# Patient Record
Sex: Male | Born: 1940 | Race: White | Hispanic: No | Marital: Married | Smoking: Never smoker
Health system: Southern US, Community
[De-identification: ages and names within clinical notes are randomized; demographics above are authoritative.]

## PROBLEM LIST (undated history)

## (undated) DIAGNOSIS — E119 Type 2 diabetes mellitus without complications: Secondary | ICD-10-CM

## (undated) DIAGNOSIS — I509 Heart failure, unspecified: Secondary | ICD-10-CM

---

## 2015-02-11 DIAGNOSIS — N138 Other obstructive and reflux uropathy: Secondary | ICD-10-CM | POA: Diagnosis present

## 2017-03-07 DIAGNOSIS — E119 Type 2 diabetes mellitus without complications: Secondary | ICD-10-CM

## 2018-07-23 DEATH — deceased

## 2020-08-10 DIAGNOSIS — N182 Chronic kidney disease, stage 2 (mild): Secondary | ICD-10-CM | POA: Diagnosis present

## 2020-08-10 DIAGNOSIS — I10 Essential (primary) hypertension: Secondary | ICD-10-CM | POA: Diagnosis present

## 2020-08-10 DIAGNOSIS — I259 Chronic ischemic heart disease, unspecified: Secondary | ICD-10-CM | POA: Diagnosis present

## 2020-08-10 DIAGNOSIS — N183 Chronic kidney disease, stage 3 unspecified: Secondary | ICD-10-CM | POA: Diagnosis present

## 2020-08-10 DIAGNOSIS — G479 Sleep disorder, unspecified: Secondary | ICD-10-CM | POA: Diagnosis present

## 2020-08-10 DIAGNOSIS — J449 Chronic obstructive pulmonary disease, unspecified: Secondary | ICD-10-CM | POA: Diagnosis present

## 2020-08-10 DIAGNOSIS — N1831 Chronic kidney disease, stage 3a: Secondary | ICD-10-CM | POA: Diagnosis present

## 2020-08-10 DIAGNOSIS — Z7722 Contact with and (suspected) exposure to environmental tobacco smoke (acute) (chronic): Secondary | ICD-10-CM

## 2020-08-10 DIAGNOSIS — I5020 Unspecified systolic (congestive) heart failure: Secondary | ICD-10-CM | POA: Diagnosis present

## 2020-08-10 DIAGNOSIS — I480 Paroxysmal atrial fibrillation: Secondary | ICD-10-CM | POA: Diagnosis present

## 2020-08-10 DIAGNOSIS — D649 Anemia, unspecified: Secondary | ICD-10-CM | POA: Diagnosis present

## 2020-08-10 HISTORY — DX: Contact with and (suspected) exposure to environmental tobacco smoke (acute) (chronic): Z77.22

## 2021-10-03 ENCOUNTER — Observation Stay: Payer: Medicare PPO

## 2021-10-03 ENCOUNTER — Other Ambulatory Visit: Payer: Self-pay

## 2021-10-03 ENCOUNTER — Inpatient Hospital Stay
Admission: EM | Admit: 2021-10-03 | Discharge: 2021-10-09 | DRG: 871 | Disposition: A | Discharge status: Home Health | Payer: Medicare PPO | Attending: Internal Medicine | Admitting: Internal Medicine

## 2021-10-03 ENCOUNTER — Emergency Department: Payer: Medicare PPO

## 2021-10-03 DIAGNOSIS — D6959 Other secondary thrombocytopenia: Secondary | ICD-10-CM | POA: Diagnosis not present

## 2021-10-03 DIAGNOSIS — A4189 Other specified sepsis: Principal | ICD-10-CM | POA: Diagnosis present

## 2021-10-03 DIAGNOSIS — N138 Other obstructive and reflux uropathy: Secondary | ICD-10-CM | POA: Diagnosis present

## 2021-10-03 DIAGNOSIS — N401 Enlarged prostate with lower urinary tract symptoms: Secondary | ICD-10-CM | POA: Diagnosis present

## 2021-10-03 DIAGNOSIS — R531 Weakness: Secondary | ICD-10-CM

## 2021-10-03 DIAGNOSIS — R6520 Severe sepsis without septic shock: Secondary | ICD-10-CM | POA: Diagnosis present

## 2021-10-03 DIAGNOSIS — E119 Type 2 diabetes mellitus without complications: Secondary | ICD-10-CM

## 2021-10-03 DIAGNOSIS — G479 Sleep disorder, unspecified: Secondary | ICD-10-CM | POA: Diagnosis present

## 2021-10-03 DIAGNOSIS — I259 Chronic ischemic heart disease, unspecified: Secondary | ICD-10-CM | POA: Diagnosis present

## 2021-10-03 DIAGNOSIS — Z20822 Contact with and (suspected) exposure to covid-19: Secondary | ICD-10-CM | POA: Diagnosis present

## 2021-10-03 DIAGNOSIS — R6883 Chills (without fever): Secondary | ICD-10-CM | POA: Diagnosis present

## 2021-10-03 DIAGNOSIS — E785 Hyperlipidemia, unspecified: Secondary | ICD-10-CM | POA: Diagnosis present

## 2021-10-03 DIAGNOSIS — N183 Chronic kidney disease, stage 3 unspecified: Secondary | ICD-10-CM | POA: Diagnosis present

## 2021-10-03 DIAGNOSIS — N1831 Chronic kidney disease, stage 3a: Secondary | ICD-10-CM | POA: Diagnosis present

## 2021-10-03 DIAGNOSIS — J1008 Influenza due to other identified influenza virus with other specified pneumonia: Secondary | ICD-10-CM | POA: Diagnosis present

## 2021-10-03 DIAGNOSIS — A419 Sepsis, unspecified organism: Secondary | ICD-10-CM | POA: Diagnosis present

## 2021-10-03 DIAGNOSIS — Z7982 Long term (current) use of aspirin: Secondary | ICD-10-CM

## 2021-10-03 DIAGNOSIS — J440 Chronic obstructive pulmonary disease with acute lower respiratory infection: Secondary | ICD-10-CM | POA: Diagnosis present

## 2021-10-03 DIAGNOSIS — Z7901 Long term (current) use of anticoagulants: Secondary | ICD-10-CM

## 2021-10-03 DIAGNOSIS — E1122 Type 2 diabetes mellitus with diabetic chronic kidney disease: Secondary | ICD-10-CM | POA: Diagnosis present

## 2021-10-03 DIAGNOSIS — I5022 Chronic systolic (congestive) heart failure: Secondary | ICD-10-CM | POA: Diagnosis present

## 2021-10-03 DIAGNOSIS — R042 Hemoptysis: Secondary | ICD-10-CM | POA: Diagnosis not present

## 2021-10-03 DIAGNOSIS — Z7722 Contact with and (suspected) exposure to environmental tobacco smoke (acute) (chronic): Secondary | ICD-10-CM | POA: Diagnosis present

## 2021-10-03 DIAGNOSIS — J159 Unspecified bacterial pneumonia: Secondary | ICD-10-CM | POA: Diagnosis present

## 2021-10-03 DIAGNOSIS — I10 Essential (primary) hypertension: Secondary | ICD-10-CM | POA: Diagnosis present

## 2021-10-03 DIAGNOSIS — I5020 Unspecified systolic (congestive) heart failure: Secondary | ICD-10-CM | POA: Diagnosis present

## 2021-10-03 DIAGNOSIS — J449 Chronic obstructive pulmonary disease, unspecified: Secondary | ICD-10-CM | POA: Diagnosis present

## 2021-10-03 DIAGNOSIS — Z7984 Long term (current) use of oral hypoglycemic drugs: Secondary | ICD-10-CM

## 2021-10-03 DIAGNOSIS — J101 Influenza due to other identified influenza virus with other respiratory manifestations: Secondary | ICD-10-CM

## 2021-10-03 DIAGNOSIS — Z79899 Other long term (current) drug therapy: Secondary | ICD-10-CM

## 2021-10-03 DIAGNOSIS — J189 Pneumonia, unspecified organism: Secondary | ICD-10-CM | POA: Diagnosis not present

## 2021-10-03 DIAGNOSIS — N179 Acute kidney failure, unspecified: Principal | ICD-10-CM | POA: Diagnosis present

## 2021-10-03 DIAGNOSIS — I2510 Atherosclerotic heart disease of native coronary artery without angina pectoris: Secondary | ICD-10-CM | POA: Diagnosis present

## 2021-10-03 DIAGNOSIS — N182 Chronic kidney disease, stage 2 (mild): Secondary | ICD-10-CM | POA: Diagnosis present

## 2021-10-03 DIAGNOSIS — J9601 Acute respiratory failure with hypoxia: Secondary | ICD-10-CM | POA: Diagnosis present

## 2021-10-03 DIAGNOSIS — I130 Hypertensive heart and chronic kidney disease with heart failure and stage 1 through stage 4 chronic kidney disease, or unspecified chronic kidney disease: Secondary | ICD-10-CM | POA: Diagnosis present

## 2021-10-03 DIAGNOSIS — I480 Paroxysmal atrial fibrillation: Secondary | ICD-10-CM | POA: Diagnosis present

## 2021-10-03 DIAGNOSIS — D649 Anemia, unspecified: Secondary | ICD-10-CM | POA: Diagnosis present

## 2021-10-03 HISTORY — DX: Type 2 diabetes mellitus without complications: E11.9

## 2021-10-03 HISTORY — DX: Heart failure, unspecified: I50.9

## 2021-10-03 LAB — BASIC METABOLIC PANEL
Anion gap: 14 (ref 5–15)
BUN: 25 mg/dL — ABNORMAL HIGH (ref 8–23)
CO2: 21 mmol/L — ABNORMAL LOW (ref 22–32)
Calcium: 9 mg/dL (ref 8.9–10.3)
Chloride: 104 mmol/L (ref 98–111)
Creatinine, Ser: 1.7 mg/dL — ABNORMAL HIGH (ref 0.61–1.24)
GFR, Estimated: 40 mL/min — ABNORMAL LOW (ref >60)
Glucose, Bld: 234 mg/dL — ABNORMAL HIGH (ref 70–99)
Potassium: 4.2 mmol/L (ref 3.5–5.1)
Sodium: 139 mmol/L (ref 135–145)

## 2021-10-03 LAB — RESP PANEL BY RT-PCR (FLU A&B, COVID) ARPGX2
Influenza A by PCR: POSITIVE — AB
Influenza B by PCR: NEGATIVE
SARS Coronavirus 2 by RT PCR: NEGATIVE

## 2021-10-03 LAB — URINALYSIS, ROUTINE W REFLEX MICROSCOPIC
Bilirubin Urine: NEGATIVE
Glucose, UA: >=500 mg/dL — AB
Ketones, ur: NEGATIVE mg/dL
Leukocytes,Ua: NEGATIVE
Nitrite: NEGATIVE
Protein, ur: 30 mg/dL — AB
Specific Gravity, Urine: 1.03 (ref 1.005–1.030)
Squamous Epithelial / HPF: NONE SEEN (ref 0–5)
pH: 5 (ref 5.0–8.0)

## 2021-10-03 LAB — CBC
HCT: 42.4 % (ref 39.0–52.0)
Hemoglobin: 13.1 g/dL (ref 13.0–17.0)
MCH: 29.4 pg (ref 26.0–34.0)
MCHC: 30.9 g/dL (ref 30.0–36.0)
MCV: 95.1 fL (ref 80.0–100.0)
Platelets: 159 10*3/uL (ref 150–400)
RBC: 4.46 MIL/uL (ref 4.22–5.81)
RDW: 15.3 % (ref 11.5–15.5)
WBC: 7.8 10*3/uL (ref 4.0–10.5)
nRBC: 0 % (ref 0.0–0.2)

## 2021-10-03 LAB — LACTIC ACID, PLASMA
Lactic Acid, Venous: 3.5 mmol/L (ref 0.5–1.9)
Lactic Acid, Venous: 1.8 mmol/L (ref 0.5–1.9)

## 2021-10-03 LAB — HEPATIC FUNCTION PANEL
ALT: 31 U/L (ref 0–44)
AST: 36 U/L (ref 15–41)
Albumin: 4.2 g/dL (ref 3.5–5.0)
Alkaline Phosphatase: 64 U/L (ref 38–126)
Bilirubin, Direct: 0.2 mg/dL (ref 0.0–0.2)
Indirect Bilirubin: 0.4 mg/dL (ref 0.3–0.9)
Total Bilirubin: 0.6 mg/dL (ref 0.3–1.2)
Total Protein: 7.9 g/dL (ref 6.5–8.1)

## 2021-10-03 LAB — CBG MONITORING, ED: Glucose-Capillary: 255 mg/dL — ABNORMAL HIGH (ref 70–99)

## 2021-10-03 LAB — PROTIME-INR
INR: 1.2 (ref 0.8–1.2)
Prothrombin Time: 15.3 seconds — ABNORMAL HIGH (ref 11.4–15.2)

## 2021-10-03 LAB — LIPASE, BLOOD: Lipase: 42 U/L (ref 11–51)

## 2021-10-03 LAB — TROPONIN I (HIGH SENSITIVITY)
Troponin I (High Sensitivity): 5 ng/L (ref <18)
Troponin I (High Sensitivity): 5 ng/L (ref <18)

## 2021-10-03 MED ORDER — DOCUSATE SODIUM 100 MG PO CAPS
100.0000 mg | ORAL_CAPSULE | Freq: Two times a day (BID) | ORAL | Status: DC
  Administered 2021-10-05 – 2021-10-09 (×7): 100 mg via ORAL
  Filled 2021-10-03 (×10): qty 1

## 2021-10-03 MED ORDER — OSELTAMIVIR PHOSPHATE 75 MG PO CAPS
75.0000 mg | ORAL_CAPSULE | Freq: Once | ORAL | Status: AC
  Administered 2021-10-03: 75 mg via ORAL
  Filled 2021-10-03: qty 1

## 2021-10-03 MED ORDER — OSELTAMIVIR PHOSPHATE 30 MG PO CAPS
30.0000 mg | ORAL_CAPSULE | Freq: Two times a day (BID) | ORAL | Status: AC
  Administered 2021-10-04 – 2021-10-08 (×10): 30 mg via ORAL
  Filled 2021-10-03 (×11): qty 1

## 2021-10-03 MED ORDER — BISACODYL 5 MG PO TBEC: 5.0000 mg | DELAYED_RELEASE_TABLET | Freq: Every day | ORAL | Status: DC | PRN

## 2021-10-03 MED ORDER — POLYETHYLENE GLYCOL 3350 17 G PO PACK: 17.0000 g | PACK | Freq: Every day | ORAL | Status: DC | PRN

## 2021-10-03 MED ORDER — SODIUM CHLORIDE 0.9 % IV BOLUS
1000.0000 mL | Freq: Once | INTRAVENOUS | Status: AC
  Administered 2021-10-03: 1000 mL via INTRAVENOUS

## 2021-10-03 MED ORDER — ALBUTEROL SULFATE (2.5 MG/3ML) 0.083% IN NEBU
2.5000 mg | INHALATION_SOLUTION | Freq: Four times a day (QID) | RESPIRATORY_TRACT | Status: DC
  Administered 2021-10-04 (×4): 2.5 mg via RESPIRATORY_TRACT
  Filled 2021-10-03 (×4): qty 3

## 2021-10-03 MED ORDER — ACETAMINOPHEN 325 MG PO TABS
650.0000 mg | ORAL_TABLET | ORAL | Status: DC | PRN
  Administered 2021-10-03 – 2021-10-07 (×9): 650 mg via ORAL
  Filled 2021-10-03 (×11): qty 2

## 2021-10-03 MED ORDER — LACTATED RINGERS IV SOLN
INTRAVENOUS | Status: DC
  Administered 2021-10-04: 800 mL via INTRAVENOUS

## 2021-10-03 MED ORDER — ACETAMINOPHEN 500 MG PO TABS
1000.0000 mg | ORAL_TABLET | Freq: Once | ORAL | Status: AC
  Administered 2021-10-03: 1000 mg via ORAL
  Filled 2021-10-03: qty 2

## 2021-10-03 MED ORDER — ONDANSETRON HCL 4 MG/2ML IJ SOLN
4.0000 mg | Freq: Once | INTRAMUSCULAR | Status: AC
Start: 2021-10-03 — End: 2021-10-03
  Administered 2021-10-03: 4 mg via INTRAVENOUS
  Filled 2021-10-03: qty 2

## 2021-10-03 NOTE — ED Provider Notes (Signed)
? ?[facility] ?Provider Note ? ? ? Event Date/Time  ? First MD Initiated Contact with Patient 10/03/21 1718   ?  (approximate) ? ? ?History  ? ?Tremors ? ? ?HPI ? ?Marvin Carrillo is a 81 y.o. male with history of diabetes, cabg, A-fib on Eliquis who comes in with concerns for tremors.  Patient reports not feeling great this morning and then the car traveling and for around a bunch of other kids for graduation recently and then he stopped to get food at [employer] when he came out of the bathroom and was really shaky with a normal blood glucose.  He denies any falls or hitting his head states that he feels nauseous and not great overall.  He does report taking Tylenol this morning around 11 AM for his arthritis.  ? ?Physical Exam  ? ?Triage Vital Signs: ?ED Triage Vitals  ?Enc Vitals Group  ?   BP 10/03/21 1703 (!) 190/95  ?   Pulse Rate 10/03/21 1703 78  ?   Resp 10/03/21 1703 (!) 22  ?   Temp 10/03/21 1703 (!) 101.1 ?F (38.4 ?C)  ?   Temp Source 10/03/21 1703 Oral  ?   SpO2 10/03/21 1703 95 %  ?   Weight 10/03/21 1704 170 lb (77.1 kg)  ?   Height 10/03/21 1704 5\' 10"  (1.778 m)  ?   Head Circumference --   ?   Peak Flow --   ?   Pain Score 10/03/21 1704 0  ?   Pain Loc --   ?   Pain Edu? --   ?   Excl. in GC? --   ? ? ?Most recent vital signs: ?Vitals:  ? 10/03/21 1703  ?BP: (!) 190/95  ?Pulse: 78  ?Resp: (!) 22  ?Temp: (!) 101.1 ?F (38.4 ?C)  ?SpO2: 95%  ? ? ? ?General: Awake, no distress.  ?CV:  Good peripheral perfusion.  ?Resp:  Normal effort.  ?Abd:  No distention.  Soft and nontender. ?Other:  No swelling noted ? ? ?ED Results / Procedures / Treatments  ? ?Labs ?(all labs ordered are listed, but only abnormal results are displayed) ?Labs Reviewed  ?PROTIME-INR - Abnormal; Notable for the following components:  ?    Result Value  ? Prothrombin Time 15.3 (*)   ? All other components within normal limits  ?CBG MONITORING, ED - Abnormal; Notable for the following components:  ?  Glucose-Capillary 255 (*)   ? All other components within normal limits  ?CULTURE, BLOOD (ROUTINE X 2)  ?CULTURE, BLOOD (ROUTINE X 2)  ?CBC  ?BASIC METABOLIC PANEL  ?LACTIC ACID, PLASMA  ?LACTIC ACID, PLASMA  ?URINALYSIS, ROUTINE W REFLEX MICROSCOPIC  ?TROPONIN I (HIGH SENSITIVITY)  ? ? ? ?EKG ? ?My interpretation of EKG: ? ?Sinus rate of 74 without any ST elevation, T wave version lead III, QTc 496 ? ?RADIOLOGY ?I have reviewed the x-ray personally interpreted I do not see any evidence of pneumonia but does have prior evidence of CABG ? ? ? ?PROCEDURES: ? ?Critical Care performed: No ? ?.1-3 Lead EKG Interpretation ?Performed by: 10/05/21, MD ?Authorized by: Concha Se, MD  ? ?  Interpretation: normal   ?  ECG rate:  70 ?  ECG rate assessment: normal   ?  Rhythm: sinus rhythm   ?  Ectopy: none   ?  Conduction: normal   ? ? ?MEDICATIONS ORDERED IN ED: ?Medications  ?ondansetron (ZOFRAN) injection 4 mg (  has no administration in time range)  ?sodium chloride 0.9 % bolus 1,000 mL (has no administration in time range)  ? ? ? ?IMPRESSION / MDM / ASSESSMENT AND PLAN / ED COURSE  ?I reviewed the triage vital signs and the nursing notes. ? ?Patient with tremors noted to be febrile.  Suspect this is the cause of patient not feeling well.  We will get blood cultures, lactate.  Given unclear source will hold off on antibiotics.  Abdomen soft and nontender so suspicion for acute abdominal process. ? ?CBC reassuring with normal white count ?BMP shows creatinine of 1.7.  Prior creatinine in earlier May was 1.1 ?Trope negative ?Influenza positive ? ? ?Attempted to stand patient up to ambulate him and he desatted down to 88% with just sitting up in bed and seemed really short of breath and weak therefore we discussed admission.  He is left with this plan. ? ?The patient is on the cardiac monitor to evaluate for evidence of arrhythmia and/or significant heart rate changes. ? ?  ? ? ?FINAL CLINICAL IMPRESSION(S) / ED  DIAGNOSES  ? ?Final diagnoses:  ?AKI (acute kidney injury) (HCC)  ?Influenza A  ?Weakness  ?Acute respiratory failure with hypoxia (HCC)  ? ? ? ?Rx / DC Orders  ? ?ED Discharge Orders   ? ? None  ? ?  ? ? ? ?Note:  This document was prepared using Dragon voice recognition software and may include unintentional dictation errors. ?  ?Concha Se, MD ?10/03/21 1957 ? ?

## 2021-10-03 NOTE — Assessment & Plan Note (Signed)
We will start pt on renally dosed tamiflu as 70 mg x 1 now followed by 30 mg bid for next 5 days. ?Droplet isolation.  ?

## 2021-10-03 NOTE — Assessment & Plan Note (Signed)
Lab Results  ?Component Value Date  ? CREATININE 1.70 (H) 10/03/2021  ?previous one reported to be 1.1. ?We will hold metformin/ farxiga entresto. ? ?

## 2021-10-03 NOTE — Assessment & Plan Note (Addendum)
A1C % 7.1 (A) 4.2 - 5.6 % ? ? [facility] [place](CLIA#[id number]) ?  ? ?Metformin/ Marcelline Deist  held due to abnormal renal function. ?Nonrenal processed / cleared meds on d/c for Diabetes management.  ? ? ? ?

## 2021-10-03 NOTE — Assessment & Plan Note (Signed)
Stable. ?PRN ventolin.  ?

## 2021-10-03 NOTE — Assessment & Plan Note (Signed)
cpap per home settings.  

## 2021-10-03 NOTE — Assessment & Plan Note (Signed)
Compensated.  ?We will continue asa 81/ Imdur.  ?Entresto held due to AKI.  ?

## 2021-10-03 NOTE — ED Notes (Signed)
Attempted to assist patient up to standing position to see how pt can tolerate per provider request. Pt barely able to sit up in bed. Pt destats to 88% when sitting up. Pt assisted back in bed. Dr. Jari Pigg at bedside observing trial. ?

## 2021-10-03 NOTE — Assessment & Plan Note (Signed)
Cont asa and atorvastatin.  ?Cont Imdur and eliquis.   ?EKG PRN. ? ?

## 2021-10-03 NOTE — ED Triage Notes (Signed)
Pt started having tremors while eating about 30 minutes ago. Pt concerned that bg was low, bg checked, bg 255. Pt states he had a coke at the restaurant to treat a possible low bg but it did not help. Pt with generalized tremors, ?

## 2021-10-03 NOTE — Assessment & Plan Note (Signed)
Pt off Beta blocker. ?Cont amiodarone and eliquis.  ? ?

## 2021-10-03 NOTE — H&P (Signed)
?History and Physical  ? ? ?PatientMarland Kitchen [name] AJO:[id number] DOB: 10/21/1940 ?DOA: 10/03/2021 ?DOS: the patient was seen and examined on 10/03/2021 ?PCP: Adele Schilder, MD  ?Patient coming from: Home ? ?Chief Complaint:  ?Chief Complaint  ?Patient presents with  ? Tremors  ? ?HPI: Marvin Carrillo is a 81 y.o. male with medical history significant of HTN, DM II. ?Pt left home and travelled to [place] for graduation of grandson. It was outdoors  ?Catered and on farm. ?Wife at bedside gives history. Visiting family in [place] and family. On way back to [facility] and he went to uses restroom . Wife got table and when he came out of restroom he was shaking all over and was freezing.  ?He was having chills. And [employer] they came her. She initially thought he was hypoglycemic and ,  ?Wife then got help to get him to car. Pt was very weak and could not walk.  ?She then stopped at ed as he was not getting better.  ? ?Review of Systems  ?Constitutional:  Positive for chills, fever and malaise/fatigue.  ?All other systems reviewed and are negative. ? ?Past Medical History:  ?Diagnosis Date  ? CHF (congestive heart failure) (HCC)   ? Diabetes mellitus without complication (HCC)   ? Passive smoker 08/10/2020  ? ?History reviewed. No pertinent surgical history. ?Social History:  reports that he has never smoked. He has never used smokeless tobacco. No history on file for alcohol use and drug use. ? ?No Known Allergies ? ?History reviewed. No pertinent family history. ? ?PTA  Medications   ?Amiodarone (PACERONE) 200 mg, oral, Daily  ? ?Amlodipine (NORVASC) 2.5 mg, oral, Daily  ? ?aspirin 81 mg EC tablet TAKE 1 TABLET BY MOUTH EVERY NIGHT AT BEDTIME  ? ?atorvastatin (LIPITOR) 80 mg tablet TAKE 1 TABLET BY MOUTH EVERY DAY  ?blood-glucose meter (True Metrix Glucose Meter) misc True Metrix Glucometer. Dx: E11.65. Duration of need--99--Lifetime. Use to test blood sugars twice daily.  ? ?Eliquis 5 mg tab TAKE 1 TABLET BY MOUTH TWICE  DAILY , STOP WARFARIN  ? ?Entresto 24-26 mg per tablet TAKE 1 TABLET BY MOUTH TWICE DAILY  ? ?Farxiga 10 mg tab tablet 1 tablet, oral, Daily  ?glucose blood (True Metrix Glucose Test Strip) test strip True Metrix Test Strips. Dx: E11.9. Duration of need--99--Lifetime. Use to test blood sugars bid.  ? ?isosorbide mononitrate (IMDUR) 30 mg, oral, Daily  ?Lancets misc 1 each, miscellaneous, Daily, Dx: Dm E11.9 check sugars bid  ? ?metformin (GLUCOPHAGE) 1,000 mg, oral, 2 times daily with meals    ? ?Physical Exam: ?Vitals:  ? 10/03/21 1950 10/03/21 2000 10/03/21 2100 10/03/21 2111  ?BP:  137/62 (!) 130/57   ?Pulse:  71 72   ?Resp:  (!) 26 (!) 24   ?Temp:    (!) 102.7 ?F (39.3 ?C)  ?TempSrc:    Oral  ?SpO2: (!) 88% 93% 95%   ?Weight:      ?Height:      ?Physical Exam ?Vitals and nursing note reviewed.  ?Constitutional:   ?   General: He is sleeping. He is not in acute distress. ?   Appearance: He is well-groomed. He is ill-appearing. He is not toxic-appearing or diaphoretic.  ?HENT:  ?   Head: Normocephalic and atraumatic.  ?   Right Ear: Hearing and external ear normal.  ?   Left Ear: Hearing and external ear normal.  ?   Nose: Nose normal. No nasal deformity.  ?  Mouth/Throat:  ?   Lips: Pink.  ?   Mouth: Mucous membranes are moist.  ?   Tongue: No lesions. Tongue deviates from midline.  ?   Pharynx: Oropharynx is clear.  ?   Comments: Deviated to left.  ?Eyes:  ?   General: Lids are normal.  ?   Extraocular Movements: Extraocular movements intact.  ?   Right eye: Normal extraocular motion and no nystagmus.  ?   Left eye: Normal extraocular motion and no nystagmus.  ?   Pupils: Pupils are equal, round, and reactive to light.  ?Cardiovascular:  ?   Rate and Rhythm: Normal rate and regular rhythm.  ?   Pulses: Normal pulses.     ?     Dorsalis pedis pulses are 2+ on the right side and 2+ on the left side.  ?   Heart sounds: Normal heart sounds.  ?Pulmonary:  ?   Effort: Pulmonary effort is normal.  ?   Breath sounds:  Examination of the right-lower field reveals rales. Examination of the left-lower field reveals rales. Rhonchi and rales present.  ?Abdominal:  ?   General: Bowel sounds are normal. There is no distension.  ?   Palpations: Abdomen is soft. There is no mass.  ?   Tenderness: There is no abdominal tenderness. There is no guarding.  ?   Hernia: No hernia is present.  ?Musculoskeletal:  ?   Right lower leg: No edema.  ?   Left lower leg: No edema.  ?Skin: ?   General: Skin is warm.  ?Neurological:  ?   General: No focal deficit present.  ?   Mental Status: He is oriented to person, place, and time and easily aroused. He is lethargic.  ?   Cranial Nerves: Cranial nerves 2-12 are intact.  ?   Motor: Motor function is intact.  ?Psychiatric:     ?   Attention and Perception: Attention normal.     ?   Speech: Speech normal.     ?   Behavior: Behavior is cooperative.     ?   Cognition and Memory: Cognition normal.  ? ? ?Data Reviewed: ?Results for orders placed or performed during the hospital encounter of 10/03/21 (from the past 24 hour(s))  ?CBG monitoring, ED     Status: Abnormal  ? Collection Time: 10/03/21  4:59 PM  ?Result Value Ref Range  ? Glucose-Capillary 255 (H) 70 - 99 mg/dL  ?Basic metabolic panel     Status: Abnormal  ? Collection Time: 10/03/21  5:26 PM  ?Result Value Ref Range  ? Sodium 139 135 - 145 mmol/L  ? Potassium 4.2 3.5 - 5.1 mmol/L  ? Chloride 104 98 - 111 mmol/L  ? CO2 21 (L) 22 - 32 mmol/L  ? Glucose, Bld 234 (H) 70 - 99 mg/dL  ? BUN 25 (H) 8 - 23 mg/dL  ? Creatinine, Ser 1.70 (H) 0.61 - 1.24 mg/dL  ? Calcium 9.0 8.9 - 10.3 mg/dL  ? GFR, Estimated 40 (L) >60 mL/min  ? Anion gap 14 5 - 15  ?CBC     Status: None  ? Collection Time: 10/03/21  5:26 PM  ?Result Value Ref Range  ? WBC 7.8 4.0 - 10.5 K/uL  ? RBC 4.46 4.22 - 5.81 MIL/uL  ? Hemoglobin 13.1 13.0 - 17.0 g/dL  ? HCT 42.4 39.0 - 52.0 %  ? MCV 95.1 80.0 - 100.0 fL  ? MCH 29.4 26.0 - 34.0 pg  ?  MCHC 30.9 30.0 - 36.0 g/dL  ? RDW 15.3 11.5 - 15.5 %   ? Platelets 159 150 - 400 K/uL  ? nRBC 0.0 0.0 - 0.2 %  ?Troponin I (High Sensitivity)     Status: None  ? Collection Time: 10/03/21  5:26 PM  ?Result Value Ref Range  ? Troponin I (High Sensitivity) 5 <18 ng/L  ?Lactic acid, plasma     Status: Abnormal  ? Collection Time: 10/03/21  5:26 PM  ?Result Value Ref Range  ? Lactic Acid, Venous 3.5 (HH) 0.5 - 1.9 mmol/L  ?Protime-INR     Status: Abnormal  ? Collection Time: 10/03/21  5:26 PM  ?Result Value Ref Range  ? Prothrombin Time 15.3 (H) 11.4 - 15.2 seconds  ? INR 1.2 0.8 - 1.2  ?Urinalysis, Routine w reflex microscopic     Status: Abnormal  ? Collection Time: 10/03/21  5:26 PM  ?Result Value Ref Range  ? Color, Urine YELLOW (A) YELLOW  ? APPearance CLEAR (A) CLEAR  ? Specific Gravity, Urine 1.030 1.005 - 1.030  ? pH 5.0 5.0 - 8.0  ? Glucose, UA >=500 (A) NEGATIVE mg/dL  ? Hgb urine dipstick SMALL (A) NEGATIVE  ? Bilirubin Urine NEGATIVE NEGATIVE  ? Ketones, ur NEGATIVE NEGATIVE mg/dL  ? Protein, ur 30 (A) NEGATIVE mg/dL  ? Nitrite NEGATIVE NEGATIVE  ? Leukocytes,Ua NEGATIVE NEGATIVE  ? RBC / HPF 0-5 0 - 5 RBC/hpf  ? WBC, UA 0-5 0 - 5 WBC/hpf  ? Bacteria, UA RARE (A) NONE SEEN  ? Squamous Epithelial / LPF NONE SEEN 0 - 5  ?Hepatic function panel     Status: None  ? Collection Time: 10/03/21  5:26 PM  ?Result Value Ref Range  ? Total Protein 7.9 6.5 - 8.1 g/dL  ? Albumin 4.2 3.5 - 5.0 g/dL  ? AST 36 15 - 41 U/L  ? ALT 31 0 - 44 U/L  ? Alkaline Phosphatase 64 38 - 126 U/L  ? Total Bilirubin 0.6 0.3 - 1.2 mg/dL  ? Bilirubin, Direct 0.2 0.0 - 0.2 mg/dL  ? Indirect Bilirubin 0.4 0.3 - 0.9 mg/dL  ?Lipase, blood     Status: None  ? Collection Time: 10/03/21  5:26 PM  ?Result Value Ref Range  ? Lipase 42 11 - 51 U/L  ?Resp Panel by RT-PCR (Flu A&B, Covid) Nasopharyngeal Swab     Status: Abnormal  ? Collection Time: 10/03/21  6:28 PM  ? Specimen: Nasopharyngeal Swab; Nasopharyngeal(NP) swabs in vial transport medium  ?Result Value Ref Range  ? SARS Coronavirus 2 by RT PCR  NEGATIVE NEGATIVE  ? Influenza A by PCR POSITIVE (A) NEGATIVE  ? Influenza B by PCR NEGATIVE NEGATIVE  ? ? ?Assessment and Plan: ?Severe sepsis (HCC) ?Pt does meets severe sepsis criteria. ?We will provi

## 2021-10-03 NOTE — Assessment & Plan Note (Signed)
Pt has h/o anemia,we will follow cbc. ?Currently resolved.  ?CBC ?   ?Component Value Date/Time  ? WBC 7.8 10/03/2021 1726  ? RBC 4.46 10/03/2021 1726  ? HGB 13.1 10/03/2021 1726  ? HCT 42.4 10/03/2021 1726  ? PLT 159 10/03/2021 1726  ? MCV 95.1 10/03/2021 1726  ? MCH 29.4 10/03/2021 1726  ? MCHC 30.9 10/03/2021 1726  ? RDW 15.3 10/03/2021 1726  ? ?Type and screen/ IV ppi.  ?

## 2021-10-03 NOTE — Assessment & Plan Note (Signed)
Blood pressure 137/62, pulse 71, temperature (!) 101.1 ?F (38.4 ?C), temperature source Oral, resp. rate (!) 26, height 5\' 10"  (1.778 m), weight 77.1 kg, SpO2 93 %. ?Cont amlodipine/ imdur.  ?PRN hydralazine.  ? ? ?

## 2021-10-03 NOTE — ED Notes (Signed)
Patient transported to CT 

## 2021-10-03 NOTE — Assessment & Plan Note (Signed)
Pt does meets severe sepsis criteria. ?We will provide IVF support and monitor electrolytes and heart rate.  ?

## 2021-10-03 NOTE — ED Notes (Signed)
Dr. Patel at bedside 

## 2021-10-03 NOTE — Assessment & Plan Note (Signed)
We will get CT abd and pelvis due to AKI. ? ?

## 2021-10-04 DIAGNOSIS — R042 Hemoptysis: Secondary | ICD-10-CM | POA: Diagnosis not present

## 2021-10-04 DIAGNOSIS — Z79899 Other long term (current) drug therapy: Secondary | ICD-10-CM | POA: Diagnosis not present

## 2021-10-04 DIAGNOSIS — J101 Influenza due to other identified influenza virus with other respiratory manifestations: Secondary | ICD-10-CM | POA: Diagnosis not present

## 2021-10-04 DIAGNOSIS — I480 Paroxysmal atrial fibrillation: Secondary | ICD-10-CM

## 2021-10-04 DIAGNOSIS — J9601 Acute respiratory failure with hypoxia: Secondary | ICD-10-CM | POA: Diagnosis present

## 2021-10-04 DIAGNOSIS — A419 Sepsis, unspecified organism: Secondary | ICD-10-CM

## 2021-10-04 DIAGNOSIS — E1122 Type 2 diabetes mellitus with diabetic chronic kidney disease: Secondary | ICD-10-CM | POA: Diagnosis present

## 2021-10-04 DIAGNOSIS — N179 Acute kidney failure, unspecified: Secondary | ICD-10-CM | POA: Diagnosis present

## 2021-10-04 DIAGNOSIS — A4189 Other specified sepsis: Secondary | ICD-10-CM | POA: Diagnosis present

## 2021-10-04 DIAGNOSIS — N182 Chronic kidney disease, stage 2 (mild): Secondary | ICD-10-CM

## 2021-10-04 DIAGNOSIS — R6883 Chills (without fever): Secondary | ICD-10-CM | POA: Diagnosis present

## 2021-10-04 DIAGNOSIS — J440 Chronic obstructive pulmonary disease with acute lower respiratory infection: Secondary | ICD-10-CM | POA: Diagnosis present

## 2021-10-04 DIAGNOSIS — Z7722 Contact with and (suspected) exposure to environmental tobacco smoke (acute) (chronic): Secondary | ICD-10-CM | POA: Diagnosis present

## 2021-10-04 DIAGNOSIS — N401 Enlarged prostate with lower urinary tract symptoms: Secondary | ICD-10-CM | POA: Diagnosis present

## 2021-10-04 DIAGNOSIS — Z7982 Long term (current) use of aspirin: Secondary | ICD-10-CM | POA: Diagnosis not present

## 2021-10-04 DIAGNOSIS — J1008 Influenza due to other identified influenza virus with other specified pneumonia: Secondary | ICD-10-CM | POA: Diagnosis present

## 2021-10-04 DIAGNOSIS — N138 Other obstructive and reflux uropathy: Secondary | ICD-10-CM | POA: Diagnosis present

## 2021-10-04 DIAGNOSIS — I5022 Chronic systolic (congestive) heart failure: Secondary | ICD-10-CM | POA: Diagnosis present

## 2021-10-04 DIAGNOSIS — N1831 Chronic kidney disease, stage 3a: Secondary | ICD-10-CM | POA: Diagnosis present

## 2021-10-04 DIAGNOSIS — J159 Unspecified bacterial pneumonia: Secondary | ICD-10-CM | POA: Diagnosis present

## 2021-10-04 DIAGNOSIS — J189 Pneumonia, unspecified organism: Secondary | ICD-10-CM | POA: Diagnosis not present

## 2021-10-04 DIAGNOSIS — D649 Anemia, unspecified: Secondary | ICD-10-CM | POA: Diagnosis present

## 2021-10-04 DIAGNOSIS — Z7901 Long term (current) use of anticoagulants: Secondary | ICD-10-CM | POA: Diagnosis not present

## 2021-10-04 DIAGNOSIS — I130 Hypertensive heart and chronic kidney disease with heart failure and stage 1 through stage 4 chronic kidney disease, or unspecified chronic kidney disease: Secondary | ICD-10-CM | POA: Diagnosis present

## 2021-10-04 DIAGNOSIS — E785 Hyperlipidemia, unspecified: Secondary | ICD-10-CM | POA: Diagnosis present

## 2021-10-04 DIAGNOSIS — Z20822 Contact with and (suspected) exposure to covid-19: Secondary | ICD-10-CM | POA: Diagnosis present

## 2021-10-04 DIAGNOSIS — R6520 Severe sepsis without septic shock: Secondary | ICD-10-CM

## 2021-10-04 DIAGNOSIS — I2510 Atherosclerotic heart disease of native coronary artery without angina pectoris: Secondary | ICD-10-CM | POA: Diagnosis present

## 2021-10-04 DIAGNOSIS — D6959 Other secondary thrombocytopenia: Secondary | ICD-10-CM | POA: Diagnosis not present

## 2021-10-04 LAB — COMPREHENSIVE METABOLIC PANEL
ALT: 124 U/L — ABNORMAL HIGH (ref 0–44)
AST: 113 U/L — ABNORMAL HIGH (ref 15–41)
Albumin: 3.7 g/dL (ref 3.5–5.0)
Alkaline Phosphatase: 54 U/L (ref 38–126)
Anion gap: 8 (ref 5–15)
BUN: 24 mg/dL — ABNORMAL HIGH (ref 8–23)
CO2: 22 mmol/L (ref 22–32)
Calcium: 8.4 mg/dL — ABNORMAL LOW (ref 8.9–10.3)
Chloride: 111 mmol/L (ref 98–111)
Creatinine, Ser: 1.3 mg/dL — ABNORMAL HIGH (ref 0.61–1.24)
GFR, Estimated: 56 mL/min — ABNORMAL LOW (ref >60)
Glucose, Bld: 129 mg/dL — ABNORMAL HIGH (ref 70–99)
Potassium: 4.2 mmol/L (ref 3.5–5.1)
Sodium: 141 mmol/L (ref 135–145)
Total Bilirubin: 0.6 mg/dL (ref 0.3–1.2)
Total Protein: 7 g/dL (ref 6.5–8.1)

## 2021-10-04 LAB — CBC
HCT: 39.4 % (ref 39.0–52.0)
Hemoglobin: 12.6 g/dL — ABNORMAL LOW (ref 13.0–17.0)
MCH: 29.7 pg (ref 26.0–34.0)
MCHC: 32 g/dL (ref 30.0–36.0)
MCV: 92.9 fL (ref 80.0–100.0)
Platelets: 143 10*3/uL — ABNORMAL LOW (ref 150–400)
RBC: 4.24 MIL/uL (ref 4.22–5.81)
RDW: 15.3 % (ref 11.5–15.5)
WBC: 7.3 10*3/uL (ref 4.0–10.5)
nRBC: 0 % (ref 0.0–0.2)

## 2021-10-04 LAB — HEMOGLOBIN A1C
Hgb A1c MFr Bld: 7.4 % — ABNORMAL HIGH (ref 4.8–5.6)
Mean Plasma Glucose: 165.68 mg/dL

## 2021-10-04 LAB — PROCALCITONIN: Procalcitonin: 0.29 ng/mL

## 2021-10-04 LAB — GLUCOSE, CAPILLARY
Glucose-Capillary: 185 mg/dL — ABNORMAL HIGH (ref 70–99)
Glucose-Capillary: 214 mg/dL — ABNORMAL HIGH (ref 70–99)
Glucose-Capillary: 156 mg/dL — ABNORMAL HIGH (ref 70–99)

## 2021-10-04 MED ORDER — ALBUTEROL SULFATE (2.5 MG/3ML) 0.083% IN NEBU
2.5000 mg | INHALATION_SOLUTION | Freq: Three times a day (TID) | RESPIRATORY_TRACT | Status: DC
  Administered 2021-10-05 (×2): 2.5 mg via RESPIRATORY_TRACT
  Filled 2021-10-04 (×2): qty 3

## 2021-10-04 MED ORDER — AMIODARONE HCL 200 MG PO TABS
200.0000 mg | ORAL_TABLET | Freq: Every day | ORAL | Status: DC
  Administered 2021-10-04 – 2021-10-09 (×6): 200 mg via ORAL
  Filled 2021-10-04 (×6): qty 1

## 2021-10-04 MED ORDER — PHENOL 1.4 % MT LIQD
1.0000 | OROMUCOSAL | Status: DC | PRN
  Administered 2021-10-04: 1 via OROMUCOSAL
  Filled 2021-10-04: qty 177

## 2021-10-04 MED ORDER — ATORVASTATIN CALCIUM 20 MG PO TABS
80.0000 mg | ORAL_TABLET | Freq: Every day | ORAL | Status: DC
  Administered 2021-10-04 – 2021-10-09 (×6): 80 mg via ORAL
  Filled 2021-10-04 (×6): qty 4

## 2021-10-04 MED ORDER — APIXABAN 5 MG PO TABS
5.0000 mg | ORAL_TABLET | Freq: Two times a day (BID) | ORAL | Status: DC
  Administered 2021-10-04 – 2021-10-09 (×11): 5 mg via ORAL
  Filled 2021-10-04 (×11): qty 1

## 2021-10-04 MED ORDER — INSULIN ASPART 100 UNIT/ML IJ SOLN
0.0000 [IU] | Freq: Three times a day (TID) | INTRAMUSCULAR | Status: DC
  Administered 2021-10-04: 2 [IU] via SUBCUTANEOUS
  Administered 2021-10-04: 3 [IU] via SUBCUTANEOUS
  Administered 2021-10-05 – 2021-10-06 (×4): 2 [IU] via SUBCUTANEOUS
  Administered 2021-10-06: 1 [IU] via SUBCUTANEOUS
  Administered 2021-10-07 – 2021-10-08 (×4): 2 [IU] via SUBCUTANEOUS
  Administered 2021-10-08: 3 [IU] via SUBCUTANEOUS
  Administered 2021-10-08 – 2021-10-09 (×2): 2 [IU] via SUBCUTANEOUS
  Filled 2021-10-04 (×15): qty 1

## 2021-10-04 MED ORDER — SODIUM CHLORIDE 0.9 % IV SOLN
3.0000 g | Freq: Four times a day (QID) | INTRAVENOUS | Status: DC
  Administered 2021-10-04 – 2021-10-05 (×5): 3 g via INTRAVENOUS
  Filled 2021-10-04 (×2): qty 8
  Filled 2021-10-04 (×2): qty 3
  Filled 2021-10-04 (×2): qty 8
  Filled 2021-10-04: qty 3

## 2021-10-04 MED ORDER — GUAIFENESIN-DM 100-10 MG/5ML PO SYRP
5.0000 mL | ORAL_SOLUTION | ORAL | Status: DC | PRN
  Administered 2021-10-04 – 2021-10-09 (×9): 5 mL via ORAL
  Filled 2021-10-04 (×9): qty 10

## 2021-10-04 NOTE — Progress Notes (Addendum)
? ? ? ?Progress Note  ? ? ?Marvin Carrillo  [id number] DOB: 1940-09-22  DOA: 10/03/2021 ?PCP: Almyra Free, MD  ? ? ? ? ?Brief Narrative:  ? ? ?Medical records reviewed and are as summarized below: ? ?Marvin Carrillo is a 81 y.o. male with medical history significant for type II DM, hypertension, hyperlipidemia, paroxysmal atrial fibrillation on Eliquis, CKD stage II, chronic systolic CHF, CAD, who presented to the hospital with fever, chills and tremors. ? ?He was admitted to the hospital for severe sepsis secondary to influenza A infection complicated by acute hypoxic respiratory failure and AKI. ? ? ? ? ?Assessment/Plan:  ? ?Principal Problem: ?  Severe sepsis ([place]) ?Active Problems: ?  Influenza A ?  Acute respiratory failure with hypoxia ([place]) ?  AKI (acute kidney injury) ([place]) ?  Type 2 diabetes mellitus ([place]) ?  Anemia ?  Essential hypertension ?  BPH with obstruction/lower urinary tract symptoms ?  Chronic ischemic heart disease ?  CKD (chronic kidney disease) stage 2, GFR 60-89 ml/min ?  Heart failure with reduced ejection fraction ([place]) ?  Paroxysmal atrial fibrillation (HCC) ?  Sleep disorder ?  Chills ? ? ? ?Body mass index is 24.39 kg/m?. ? ? ?Severe sepsis secondary to influenza A infection: Continue Tamiflu.  Discontinue IV fluids to avoid fluid overload.  Discontinue IV Unasyn. ? ?Acute hypoxic respiratory failure: Continue 2 L/min oxygen via nasal cannula.  Taper off oxygen as able. ? ?AKI on CKD stage II: Creatinine is improving.  Monitor BMP off IV fluids ? ?Thrombocytopenia and elevated liver enzymes: This is likely from sepsis.  Monitor CBC and liver enzymes. ? ?Generalized weakness: Consult PT ? ?Paroxysmal atrial fibrillation: Continue Eliquis and amiodarone. ? ?Chronic systolic CHF: Compensated.  Hold Entresto because of AKI.  2D echo in August 2020 showed EF estimated at 35 to 40%. ? ?Type II DM: Hold [place].  NovoLog as needed for hyperglycemia. ? ?Diet Order   ? ?       ?  Diet heart healthy/carb  modified Room service appropriate? Yes; Fluid consistency: Thin  Diet effective now       ?  ? ?  ?  ? ?  ? ? ? ? ? ? ? ? ?Consultants: ?None ? ?Procedures: ?None ? ? ? ?Medications:  ? ? albuterol  2.5 mg Nebulization Q6H  ? amiodarone  200 mg Oral Daily  ? apixaban  5 mg Oral BID  ? atorvastatin  80 mg Oral Daily  ? docusate sodium  100 mg Oral BID  ? insulin aspart  0-9 Units Subcutaneous TID WC  ? oseltamivir  30 mg Oral BID  ? ?Continuous Infusions: ? ampicillin-sulbactam (UNASYN) IV Stopped (10/04/21 [id number])  ? lactated ringers 75 mL/hr at 10/04/21 1126  ? ? ? ?Anti-infectives (From admission, onward)  ? ? Start     Dose/Rate Route Frequency Ordered Stop  ? 10/04/21 1000  oseltamivir (TAMIFLU) capsule 30 mg       ? 30 mg Oral 2 times daily 10/03/21 1959 10/09/21 0959  ? 10/04/21 0200  Ampicillin-Sulbactam (UNASYN) 3 g in sodium chloride 0.9 % 100 mL IVPB       ? 3 g ?200 mL/hr over 30 Minutes Intravenous Every 6 hours 10/04/21 0110    ? 10/03/21 2015  oseltamivir (TAMIFLU) capsule 75 mg       ? 75 mg Oral  Once 10/03/21 1953 10/03/21 2029  ? ?  ? ? ? ? ? ? ? ? ? ?  Family Communication/Anticipated D/C date and plan/Code Status  ? ?DVT prophylaxis:  ?apixaban (ELIQUIS) tablet 5 mg  ? ?  Code Status: Full Code ? ?Family Communication: Plan discussed with his wife at the bedside ?Disposition Plan: Plan to discharge home in 1 to 2 days ? ? ?Status is: Observation ?The patient will require care spanning > 2 midnights and should be moved to inpatient because: Hypoxia ? ? ? ? ? ? ?Subjective:  ? ?Interval events noted.  He had fever this morning with Tmax of 102.9 ?F.  He complains of generalized weakness and cough. ? ?Objective:  ? ? ?Vitals:  ? 10/04/21 0205 10/04/21 0550 10/04/21 0717 10/04/21 0840  ?BP:  (!) 147/67  (!) 127/55  ?Pulse:  65  67  ?Resp:  16  17  ?Temp:  (!) 102.9 ?F (39.4 ?C)  (!) 100.7 ?F (38.2 ?C)  ?TempSrc:      ?SpO2: 91% (!) 88% (!) 84% 95%  ?Weight:      ?Height:      ? ?No data  found. ? ? ?Intake/Output Summary (Last 24 hours) at 10/04/2021 1210 ?Last data filed at 10/04/2021 1126 ?Gross per 24 hour  ?Intake 1471.48 ml  ?Output 200 ml  ?Net 1271.48 ml  ? ?Filed Weights  ? 10/03/21 1704  ?Weight: 77.1 kg  ? ? ?Exam: ? ?GEN: NAD ?SKIN: No rash ?EYES: EOMI ?ENT: MMM ?CV: RRR ?PULM: Occasional mild wheezing ?ABD: soft, ND, NT, +BS ?CNS: AAO x 3, non focal ?EXT: No edema or tenderness ? ? ? ?  ? ? ?Data Reviewed:  ? ?I have personally reviewed following labs and imaging studies: ? ?Labs: ?Labs show the following:  ? ?Basic Metabolic Panel: ?Recent Labs  ?Lab 10/03/21 ?1726 10/04/21 ?[id number]  ?NA 139 141  ?K 4.2 4.2  ?CL 104 111  ?CO2 21* 22  ?GLUCOSE 234* 129*  ?BUN 25* 24*  ?CREATININE 1.70* 1.30*  ?CALCIUM 9.0 8.4*  ? ?GFR ?Estimated Creatinine Clearance: 46.8 mL/min (A) (by C-G formula based on SCr of 1.3 mg/dL (H)). ?Liver Function Tests: ?Recent Labs  ?Lab 10/03/21 ?1726 10/04/21 ?[id number]  ?AST 36 113*  ?ALT 31 124*  ?ALKPHOS 64 54  ?BILITOT 0.6 0.6  ?PROT 7.9 7.0  ?ALBUMIN 4.2 3.7  ? ?Recent Labs  ?Lab 10/03/21 ?1726  ?LIPASE 42  ? ?No results for input(s): AMMONIA in the last 168 hours. ?Coagulation profile ?Recent Labs  ?Lab 10/03/21 ?1726  ?INR 1.2  ? ? ?CBC: ?Recent Labs  ?Lab 10/03/21 ?1726 10/04/21 ?[id number]  ?WBC 7.8 7.3  ?HGB 13.1 12.6*  ?HCT 42.4 39.4  ?MCV 95.1 92.9  ?PLT 159 143*  ? ?Cardiac Enzymes: ?No results for input(s): CKTOTAL, CKMB, CKMBINDEX, TROPONINI in the last 168 hours. ?BNP (last 3 results) ?No results for input(s): PROBNP in the last 8760 hours. ?CBG: ?Recent Labs  ?Lab 10/03/21 ?[id number]  ?GLUCAP 255*  ? ?D-Dimer: ?No results for input(s): DDIMER in the last 72 hours. ?Hgb A1c: ?Recent Labs  ?  10/03/21 ?1726  ?HGBA1C 7.4*  ? ?Lipid Profile: ?No results for input(s): CHOL, HDL, LDLCALC, TRIG, CHOLHDL, LDLDIRECT in the last 72 hours. ?Thyroid function studies: ?No results for input(s): TSH, T4TOTAL, T3FREE, THYROIDAB in the last 72 hours. ? ?Invalid input(s): FREET3 ?Anemia  work up: ?No results for input(s): VITAMINB12, FOLATE, FERRITIN, TIBC, IRON, RETICCTPCT in the last 72 hours. ?Sepsis Labs: ?Recent Labs  ?Lab 10/03/21 ?1726 10/03/21 ?2027 10/04/21 ?[id number]  ?PROCALCITON  --   --  0.29  ?WBC  7.8  --  7.3  ?LATICACIDVEN 3.5* 1.8  --   ? ? ?Microbiology ?Recent Results (from the past 240 hour(s))  ?Culture, blood (Routine x 2)     Status: None (Preliminary result)  ? Collection Time: 10/03/21  5:26 PM  ? Specimen: BLOOD  ?Result Value Ref Range Status  ? Specimen Description BLOOD BOTTLES DRAWN AEROBIC AND ANAEROBIC  Final  ? Special Requests   Final  ?  Blood Culture results may not be optimal due to an inadequate volume of blood received in culture bottles BLOOD RIGHT HAND  ? Culture   Final  ?  NO GROWTH < 24 HOURS ?Performed at [facility], [address] [place], [place] [address] ?  ? Report Status PENDING  Incomplete  ?Culture, blood (Routine x 2)     Status: None (Preliminary result)  ? Collection Time: 10/03/21  5:26 PM  ? Specimen: BLOOD  ?Result Value Ref Range Status  ? Specimen Description BLOOD BOTTLES DRAWN AEROBIC AND ANAEROBIC  Final  ? Special Requests Blood Culture adequate volume BLOOD LEFT HAND  Final  ? Culture   Final  ?  NO GROWTH < 24 HOURS ?Performed at [facility], [address], [place], [place] [address] ?  ? Report Status PENDING  Incomplete  ?Resp Panel by RT-PCR (Flu A&B, Covid) Nasopharyngeal Swab     Status: Abnormal  ? Collection Time: 10/03/21  6:28 PM  ? Specimen: Nasopharyngeal Swab; Nasopharyngeal(NP) swabs in vial transport medium  ?Result Value Ref Range Status  ? SARS Coronavirus 2 by RT PCR NEGATIVE NEGATIVE Final  ?  Comment: (NOTE) ?SARS-CoV-2 target nucleic acids are NOT DETECTED. ? ?The SARS-CoV-2 RNA is generally detectable in upper respiratory ?specimens during the acute phase of infection. The lowest ?concentration of SARS-CoV-2 viral copies this assay can detect is ?138 copies/mL. A negative result does not  preclude SARS-Cov-2 ?infection and should not be used as the sole basis for treatment or ?other patient management decisions. A negative result may occur with  ?improper specimen collection/handling, submission of specimen o

## 2021-10-04 NOTE — Evaluation (Signed)
Physical Therapy Evaluation ?Patient Details ?Name: Marvin Carrillo ?MRN: [id number] ?DOB: 06-Jan-1941 ?Today's Date: 10/04/2021 ? ?History of Present Illness ? presented to ER secondary to acute onset of weakness, chills, tremors; admitted for management of acute respiratory failure, AKI and sepsis related to influenza A  ?Clinical Impression ? Patient resting in bed upon arrival to room; easily awakens to voice, agreeable to participation with session.  Denies pain and reports improvement in symptoms since initial admission.  Generally weak and deconditioned throughout all extremities due to acute illness, but no focal weakness appreciated; strength and ROM largely functional for basic transfers and mobility needs.  Able to complete bed mobility with mod indep; sit/stand, basic transfers and gait (30') with RW, cga/min assist.  Demonstrates reciprocal stepping pattern, slightly choppy and unsteady stepping performance; limited balance reactions, fair dependance on RW.  Do recommend continued use of RW for all gait efforts at this time; patient voiced understanding and agreement (reports having access to RW at home). ?Does endorse mild dizziness with initial transition to upright; vitals stable and WFL. No orthostasis noted.  Weaned to RA during session, maintaining sats >93-95% throughout session (at rest and with gait); left on RA end of session.  RN informed/aware. ?Would benefit from skilled PT to address above deficits and promote optimal return to PLOF.; Recommend transition to HHPT upon discharge from acute hospitalization. ?   ?   ? ?Recommendations for follow up therapy are one component of a multi-disciplinary discharge planning process, led by the attending physician.  Recommendations may be updated based on patient status, additional functional criteria and insurance authorization. ? ?Follow Up Recommendations Home health PT ? ?  ?Assistance Recommended at Discharge PRN  ?Patient can return home with the  following ? A little help with walking and/or transfers;A little help with bathing/dressing/bathroom ? ?  ?Equipment Recommendations    ?Recommendations for Other Services ?    ?  ?Functional Status Assessment Patient has had a recent decline in their functional status and demonstrates the ability to make significant improvements in function in a reasonable and predictable amount of time.  ? ?  ?Precautions / Restrictions Precautions ?Precautions: Fall ?Restrictions ?Weight Bearing Restrictions: No  ? ?  ? ?Mobility ? Bed Mobility ?Overal bed mobility: Modified Independent ?  ?  ?  ?  ?  ?  ?  ?  ? ?Transfers ?Overall transfer level: Needs assistance ?Equipment used: Rolling walker (2 wheels) ?Transfers: Sit to/from Stand ?Sit to Stand: Min guard ?  ?  ?  ?  ?  ?General transfer comment: min cuing for safety with transfers to prevent pulling on RW ?  ? ?Ambulation/Gait ?Ambulation/Gait assistance: Min guard, Min assist ?Gait Distance (Feet): 30 Feet ?Assistive device: Rolling walker (2 wheels) ?  ?  ?  ?  ?General Gait Details: reciprocal stepping pattern, slightly choppy and unsteady stepping performance; limited balance reactions, fair dependance on RW.  Do recommend continued use of RW for all gait efforts at this time; patient voiced understanding and agreement (reports having access to RW at home) ? ?Stairs ?  ?  ?  ?  ?  ? ?Wheelchair Mobility ?  ? ?Modified Rankin (Stroke Patients Only) ?  ? ?  ? ?Balance Overall balance assessment: Needs assistance ?Sitting-balance support: No upper extremity supported, Feet supported ?Sitting balance-Leahy Scale: Good ?  ?  ?Standing balance support: Bilateral upper extremity supported ?Standing balance-Leahy Scale: Fair ?  ?  ?  ?  ?  ?  ?  ?  ?  ?  ?  ?  ?   ? ? ? ?  Pertinent Vitals/Pain Pain Assessment ?Pain Assessment: No/denies pain  ? ? ?Home Living Family/patient expects to be discharged to:: Private residence ?Living Arrangements: Spouse/significant other ?Available  Help at Discharge: Family;Available PRN/intermittently ?Type of Home: House ?Home Access: Stairs to enter ?  ?Entrance Stairs-Number of Steps: 2 garage, none in back (typically uses back entrance) ?  ?Home Layout: Laundry or work area in basement;Two level;Able to live on main level with bedroom/bathroom ?Home Equipment: Agricultural consultant (2 wheels) ?   ?  ?Prior Function Prior Level of Function : Independent/Modified Independent ?  ?  ?  ?  ?  ?  ?Mobility Comments: Indep with ADLs, household and community mobilization without assist device; denies fall history.  In town for [employer] graduation ceremony (from [place] area) ?  ?  ? ? ?Hand Dominance  ?   ? ?  ?Extremity/Trunk Assessment  ? Upper Extremity Assessment ?Upper Extremity Assessment: Overall WFL for tasks assessed ?  ? ?Lower Extremity Assessment ?Lower Extremity Assessment: Overall WFL for tasks assessed (grossly at least 4/5 throughout) ?  ? ?   ?Communication  ? Communication: No difficulties  ?Cognition Arousal/Alertness: Awake/alert ?Behavior During Therapy: [facility] for tasks assessed/performed ?Overall Cognitive Status: Within Functional Limits for tasks assessed ?  ?  ?  ?  ?  ?  ?  ?  ?  ?  ?  ?  ?  ?  ?  ?  ?  ?  ?  ? ?  ?General Comments   ? ?  ?Exercises Other Exercises ?Other Exercises: Reviewed role of PT and progressive mobility, safety with transfers/gait, use of RW; patient voiced understanding of all information. ?Other Exercises: Mild dizziness with initial transition to upright; resolves with accommodation to position.  Vitals stable and WFL; no orthostasis noted.  ? ?Assessment/Plan  ?  ?PT Assessment Patient needs continued PT services  ?PT Problem List Decreased strength;Decreased activity tolerance;Decreased balance;Decreased mobility;Decreased knowledge of use of DME;Decreased safety awareness;Decreased knowledge of precautions;Cardiopulmonary status limiting activity ? ?   ?  ?PT Treatment Interventions DME instruction;Gait  training;Stair training;Functional mobility training;Therapeutic activities;Therapeutic exercise;Balance training;Patient/family education   ? ?PT Goals (Current goals can be found in the Care Plan section)  ?Acute Rehab PT Goals ?Patient Stated Goal: to return home ?PT Goal Formulation: With patient ?Time For Goal Achievement: 10/18/21 ?Potential to Achieve Goals: Good ? ?  ?Frequency Min 2X/week ?  ? ? ?Co-evaluation   ?  ?  ?  ?  ? ? ?  ?AM-PAC PT "6 Clicks" Mobility  ?Outcome Measure Help needed turning from your back to your side while in a flat bed without using bedrails?: None ?Help needed moving from lying on your back to sitting on the side of a flat bed without using bedrails?: None ?Help needed moving to and from a bed to a chair (including a wheelchair)?: A Little ?Help needed standing up from a chair using your arms (e.g., wheelchair or bedside chair)?: A Little ?Help needed to walk in hospital room?: A Little ?Help needed climbing 3-5 steps with a railing? : A Little ?6 Click Score: 20 ? ?  ?End of Session Equipment Utilized During Treatment: Gait belt ?Activity Tolerance: Patient tolerated treatment well ?Patient left: in bed;with call bell/phone within reach;with bed alarm set ?Nurse Communication: Mobility status ?PT Visit Diagnosis: Muscle weakness (generalized) (M62.81);Difficulty in walking, not elsewhere classified (R26.2) ?  ? ?Time: 9798-9211 ?PT Time Calculation (min) (ACUTE ONLY): 20 min ? ? ?Charges:   PT Evaluation ?$PT Eval  Moderate Complexity: 1 Mod ?PT Treatments ?$Therapeutic Activity: 8-22 mins ?  ?   ? ? ?Jeovany Huitron H. Manson PasseyBrown, PT, DPT, NCS ?10/04/21, 2:38 PM ?[phone] ? ?

## 2021-10-04 NOTE — Progress Notes (Signed)
?   10/03/21 2245  ?Assess: MEWS Score  ?Level of Consciousness Alert  ?Assess: MEWS Score  ?MEWS Temp 2  ?MEWS Systolic 0  ?MEWS Pulse 0  ?MEWS RR 1  ?MEWS LOC 0  ?MEWS Score 3  ?MEWS Score Color Yellow  ?Assess: if the MEWS score is Yellow or Red  ?Were vital signs taken at a resting state? Yes  ?Focused Assessment No change from prior assessment  ?Does the patient meet 2 or more of the SIRS criteria? Yes  ?Does the patient have a confirmed or suspected source of infection? Yes  ?Provider and Rapid Response Notified? No  ?MEWS guidelines implemented *See Row Information* No, previously yellow, continue vital signs every 4 hours  ?Treat  ?Pain Scale 0-10  ?Pain Score 0  ?Escalate  ?MEWS: Escalate Yellow: discuss with charge nurse/RN and consider discussing with provider and RRT  ?Notify: Charge Nurse/RN  ?Name of Charge Nurse/RN Notified Floyd, RN  ?Date Charge Nurse/RN Notified 10/03/21  ?Time Charge Nurse/RN Notified 2300  ? ? ?

## 2021-10-05 DIAGNOSIS — A419 Sepsis, unspecified organism: Secondary | ICD-10-CM | POA: Diagnosis not present

## 2021-10-05 DIAGNOSIS — N179 Acute kidney failure, unspecified: Secondary | ICD-10-CM | POA: Diagnosis not present

## 2021-10-05 DIAGNOSIS — J9601 Acute respiratory failure with hypoxia: Secondary | ICD-10-CM | POA: Diagnosis not present

## 2021-10-05 DIAGNOSIS — R6520 Severe sepsis without septic shock: Secondary | ICD-10-CM | POA: Diagnosis not present

## 2021-10-05 LAB — CBC
HCT: 37.3 % — ABNORMAL LOW (ref 39.0–52.0)
Hemoglobin: 11.7 g/dL — ABNORMAL LOW (ref 13.0–17.0)
MCH: 29.5 pg (ref 26.0–34.0)
MCHC: 31.4 g/dL (ref 30.0–36.0)
MCV: 94 fL (ref 80.0–100.0)
Platelets: 142 10*3/uL — ABNORMAL LOW (ref 150–400)
RBC: 3.97 MIL/uL — ABNORMAL LOW (ref 4.22–5.81)
RDW: 15.2 % (ref 11.5–15.5)
WBC: 8.7 10*3/uL (ref 4.0–10.5)
nRBC: 0 % (ref 0.0–0.2)

## 2021-10-05 LAB — COMPREHENSIVE METABOLIC PANEL
ALT: 394 U/L — ABNORMAL HIGH (ref 0–44)
AST: 330 U/L — ABNORMAL HIGH (ref 15–41)
Albumin: 3.1 g/dL — ABNORMAL LOW (ref 3.5–5.0)
Alkaline Phosphatase: 45 U/L (ref 38–126)
Anion gap: 9 (ref 5–15)
BUN: 25 mg/dL — ABNORMAL HIGH (ref 8–23)
CO2: 25 mmol/L (ref 22–32)
Calcium: 8.1 mg/dL — ABNORMAL LOW (ref 8.9–10.3)
Chloride: 107 mmol/L (ref 98–111)
Creatinine, Ser: 1.34 mg/dL — ABNORMAL HIGH (ref 0.61–1.24)
GFR, Estimated: 54 mL/min — ABNORMAL LOW (ref >60)
Glucose, Bld: 121 mg/dL — ABNORMAL HIGH (ref 70–99)
Potassium: 3.9 mmol/L (ref 3.5–5.1)
Sodium: 141 mmol/L (ref 135–145)
Total Bilirubin: 0.6 mg/dL (ref 0.3–1.2)
Total Protein: 6.2 g/dL — ABNORMAL LOW (ref 6.5–8.1)

## 2021-10-05 LAB — GLUCOSE, CAPILLARY
Glucose-Capillary: 154 mg/dL — ABNORMAL HIGH (ref 70–99)
Glucose-Capillary: 181 mg/dL — ABNORMAL HIGH (ref 70–99)
Glucose-Capillary: 156 mg/dL — ABNORMAL HIGH (ref 70–99)
Glucose-Capillary: 154 mg/dL — ABNORMAL HIGH (ref 70–99)

## 2021-10-05 MED ORDER — ALBUTEROL SULFATE (2.5 MG/3ML) 0.083% IN NEBU: 2.5000 mg | INHALATION_SOLUTION | Freq: Four times a day (QID) | RESPIRATORY_TRACT | Status: DC | PRN

## 2021-10-05 NOTE — Progress Notes (Signed)
? ? ? ?Progress Note  ? ? ?Marvin Carrillo  NAT:[id number] DOB: 01-22-1941  DOA: 10/03/2021 ?PCP: Adele Schilder, MD  ? ? ? ? ?Brief Narrative:  ? ? ?Medical records reviewed and are as summarized below: ? ?Marvin Carrillo is a 81 y.o. male with medical history significant for type II DM, hypertension, hyperlipidemia, paroxysmal atrial fibrillation on Eliquis, CKD stage II, chronic systolic CHF, CAD, who presented to the hospital with fever, chills and tremors. ? ?He was admitted to the hospital for severe sepsis secondary to influenza A infection complicated by acute hypoxic respiratory failure and AKI. ? ? ? ? ?Assessment/Plan:  ? ?Principal Problem: ?  Severe sepsis (HCC) ?Active Problems: ?  Influenza A ?  Acute respiratory failure with hypoxia (HCC) ?  AKI (acute kidney injury) (HCC) ?  Type 2 diabetes mellitus (HCC) ?  Anemia ?  Essential hypertension ?  BPH with obstruction/lower urinary tract symptoms ?  Chronic ischemic heart disease ?  CKD (chronic kidney disease) stage 2, GFR 60-89 ml/min ?  Heart failure with reduced ejection fraction (HCC) ?  Paroxysmal atrial fibrillation (HCC) ?  Sleep disorder ?  Chills ? ? ? ?Body mass index is 24.39 kg/m?. ? ? ?Severe sepsis secondary to influenza A infection: Continue Tamiflu.  Discontinue IV Unasyn and monitor off antibacterial agents ? ?Acute hypoxic respiratory failure: Improved.  He is tolerating room air. ? ?AKI on CKD stage II: Creatinine is better. ? ?Thrombocytopenia and elevated liver enzymes: Platelet count is stable but liver enzymes are trending upward.  Repeat liver enzymes tomorrow. ? ?Generalized weakness: PT recommended home health therapy ? ?Paroxysmal atrial fibrillation: Continue Eliquis and amiodarone. ? ?Chronic systolic CHF: Compensated.  Hold Entresto because of AKI.  2D echo in August 2020 showed EF estimated at 35 to 40%. ? ?Type II DM: Hold [place].  NovoLog as needed for hyperglycemia. ? ?Diet Order   ? ?       ?  Diet heart healthy/carb modified Room  service appropriate? Yes; Fluid consistency: Thin  Diet effective now       ?  ? ?  ?  ? ?  ? ? ? ? ? ? ? ? ?Consultants: ?None ? ?Procedures: ?None ? ? ? ?Medications:  ? ? albuterol  2.5 mg Nebulization TID  ? amiodarone  200 mg Oral Daily  ? apixaban  5 mg Oral BID  ? atorvastatin  80 mg Oral Daily  ? docusate sodium  100 mg Oral BID  ? insulin aspart  0-9 Units Subcutaneous TID WC  ? oseltamivir  30 mg Oral BID  ? ?Continuous Infusions: ? ? ? ? ?Anti-infectives (From admission, onward)  ? ? Start     Dose/Rate Route Frequency Ordered Stop  ? 10/04/21 1000  oseltamivir (TAMIFLU) capsule 30 mg       ? 30 mg Oral 2 times daily 10/03/21 1959 10/09/21 0959  ? 10/04/21 0200  Ampicillin-Sulbactam (UNASYN) 3 g in sodium chloride 0.9 % 100 mL IVPB  Status:  Discontinued       ? 3 g ?200 mL/hr over 30 Minutes Intravenous Every 6 hours 10/04/21 0110 10/05/21 0737  ? 10/03/21 2015  oseltamivir (TAMIFLU) capsule 75 mg       ? 75 mg Oral  Once 10/03/21 1953 10/03/21 2029  ? ?  ? ? ? ? ? ? ? ? ? ?Family Communication/Anticipated D/C date and plan/Code Status  ? ?DVT prophylaxis:  ?apixaban (ELIQUIS) tablet 5 mg  ? ?  Code Status: Full Code ? ?Family Communication: None ?Disposition Plan: Plan to discharge home tomorrow ? ? ?Status is: Observation ?The patient will require care spanning > 2 midnights and should be moved to inpatient because: Hypoxia ? ? ? ? ? ? ?Subjective:  ? ?Interval events noted.  He complains of cough and congestion.  He still feels weak although he is feeling better today.  No fever or chills.  No shortness of breath or chest pain  ? ?Objective:  ? ? ?Vitals:  ? 10/05/21 0014 10/05/21 0522 10/05/21 0747 10/05/21 1406  ?BP: 137/64 (!) 168/74 (!) 142/66   ?Pulse: (!) 57 (!) 58    ?Resp: 18 18 18    ?Temp: 98.5 ?F (36.9 ?C) 99.6 ?F (37.6 ?C) 98.6 ?F (37 ?C)   ?TempSrc:  Oral Oral   ?SpO2: 95% 96% 95% 95%  ?Weight:      ?Height:      ? ?No data found. ? ? ?Intake/Output Summary (Last 24 hours) at 10/05/2021  1528 ?Last data filed at 10/05/2021 1400 ?Gross per 24 hour  ?Intake 880 ml  ?Output 650 ml  ?Net 230 ml  ? ?Filed Weights  ? 10/03/21 1704  ?Weight: 77.1 kg  ? ? ?Exam: ? ?GEN: NAD ?SKIN: No rash ?EYES: EOMI ?ENT: MMM ?CV: RRR ?PULM: CTA B ?ABD: soft, ND, NT, +BS ?CNS: AAO x 3, non focal ?EXT: No edema or tenderness ? ? ? ? ? ?  ? ? ?Data Reviewed:  ? ?I have personally reviewed following labs and imaging studies: ? ?Labs: ?Labs show the following:  ? ?Basic Metabolic Panel: ?Recent Labs  ?Lab 10/03/21 ?1726 10/04/21 ?[id number] 10/05/21 ?0330  ?NA 139 141 141  ?K 4.2 4.2 3.9  ?CL 104 111 107  ?CO2 21* 22 25  ?GLUCOSE 234* 129* 121*  ?BUN 25* 24* 25*  ?CREATININE 1.70* 1.30* 1.34*  ?CALCIUM 9.0 8.4* 8.1*  ? ?GFR ?Estimated Creatinine Clearance: 45.4 mL/min (A) (by C-G formula based on SCr of 1.34 mg/dL (H)). ?Liver Function Tests: ?Recent Labs  ?Lab 10/03/21 ?1726 10/04/21 ?[id number] 10/05/21 ?0330  ?AST 36 113* 330*  ?ALT 31 124* 394*  ?ALKPHOS 64 54 45  ?BILITOT 0.6 0.6 0.6  ?PROT 7.9 7.0 6.2*  ?ALBUMIN 4.2 3.7 3.1*  ? ?Recent Labs  ?Lab 10/03/21 ?1726  ?LIPASE 42  ? ?No results for input(s): AMMONIA in the last 168 hours. ?Coagulation profile ?Recent Labs  ?Lab 10/03/21 ?1726  ?INR 1.2  ? ? ?CBC: ?Recent Labs  ?Lab 10/03/21 ?1726 10/04/21 ?[id number] 10/05/21 ?0330  ?WBC 7.8 7.3 8.7  ?HGB 13.1 12.6* 11.7*  ?HCT 42.4 39.4 37.3*  ?MCV 95.1 92.9 94.0  ?PLT 159 143* 142*  ? ?Cardiac Enzymes: ?No results for input(s): CKTOTAL, CKMB, CKMBINDEX, TROPONINI in the last 168 hours. ?BNP (last 3 results) ?No results for input(s): PROBNP in the last 8760 hours. ?CBG: ?Recent Labs  ?Lab 10/04/21 ?1210 10/04/21 ?1626 10/04/21 ?2158 10/05/21 ?[id number] 10/05/21 ?1151  ?GLUCAP 185* 214* 156* 154* 181*  ? ?D-Dimer: ?No results for input(s): DDIMER in the last 72 hours. ?Hgb A1c: ?Recent Labs  ?  10/03/21 ?1726  ?HGBA1C 7.4*  ? ?Lipid Profile: ?No results for input(s): CHOL, HDL, LDLCALC, TRIG, CHOLHDL, LDLDIRECT in the last 72 hours. ?Thyroid function  studies: ?No results for input(s): TSH, T4TOTAL, T3FREE, THYROIDAB in the last 72 hours. ? ?Invalid input(s): FREET3 ?Anemia work up: ?No results for input(s): VITAMINB12, FOLATE, FERRITIN, TIBC, IRON, RETICCTPCT in the last 72 hours. ?Sepsis Labs: ?Recent  Labs  ?Lab 10/03/21 ?1726 10/03/21 ?2027 10/04/21 ?[id number] 10/05/21 ?0330  ?PROCALCITON  --   --  0.29  --   ?WBC 7.8  --  7.3 8.7  ?LATICACIDVEN 3.5* 1.8  --   --   ? ? ?Microbiology ?Recent Results (from the past 240 hour(s))  ?Culture, blood (Routine x 2)     Status: None (Preliminary result)  ? Collection Time: 10/03/21  5:26 PM  ? Specimen: BLOOD  ?Result Value Ref Range Status  ? Specimen Description BLOOD BOTTLES DRAWN AEROBIC AND ANAEROBIC  Final  ? Special Requests   Final  ?  Blood Culture results may not be optimal due to an inadequate volume of blood received in culture bottles BLOOD RIGHT HAND  ? Culture   Final  ?  NO GROWTH 2 DAYS ?Performed at [facility], [address] [place], [place] [address] ?  ? Report Status PENDING  Incomplete  ?Culture, blood (Routine x 2)     Status: None (Preliminary result)  ? Collection Time: 10/03/21  5:26 PM  ? Specimen: BLOOD  ?Result Value Ref Range Status  ? Specimen Description BLOOD BOTTLES DRAWN AEROBIC AND ANAEROBIC  Final  ? Special Requests Blood Culture adequate volume BLOOD LEFT HAND  Final  ? Culture   Final  ?  NO GROWTH 2 DAYS ?Performed at [facility], [address], [place], [place] [address] ?  ? Report Status PENDING  Incomplete  ?Resp Panel by RT-PCR (Flu A&B, Covid) Nasopharyngeal Swab     Status: Abnormal  ? Collection Time: 10/03/21  6:28 PM  ? Specimen: Nasopharyngeal Swab; Nasopharyngeal(NP) swabs in vial transport medium  ?Result Value Ref Range Status  ? SARS Coronavirus 2 by RT PCR NEGATIVE NEGATIVE Final  ?  Comment: (NOTE) ?SARS-CoV-2 target nucleic acids are NOT DETECTED. ? ?The SARS-CoV-2 RNA is generally detectable in upper respiratory ?specimens during the acute  phase of infection. The lowest ?concentration of SARS-CoV-2 viral copies this assay can detect is ?138 copies/mL. A negative result does not preclude SARS-Cov-2 ?infection and should not be used as the

## 2021-10-05 NOTE — TOC Progression Note (Signed)
Transition of Care (TOC) - Progression Note  ? ? ?Patient Details  ?Name: Marvin Carrillo ?MRN: [id number] ?Date of Birth: [birth year] ? ?Transition of Care (TOC) CM/SW Contact  ?Pete Pelt, RN ?Phone Number: ?10/05/2021, 11:15 AM ? ?Clinical Narrative:   Notified [facility] of needs.  No equipment recommendations from PT ? ? ? ?  ?  ? ?Expected Discharge Plan and Services ?  ?  ?  ?  ?  ?                ?  ?  ?  ?  ?  ?  ?  ?  ?  ?  ? ? ?Social Determinants of Health (SDOH) Interventions ?  ? ?Readmission Risk Interventions ?   ? View : No data to display.  ?  ?  ?  ? ? ?

## 2021-10-05 NOTE — Progress Notes (Signed)
Physical Therapy Treatment ?Patient Details ?Name: Marvin Carrillo ?MRN: [id number] ?DOB: 03/25/1941 ?Today's Date: 10/05/2021 ? ? ?History of Present Illness presented to ER secondary to acute onset of weakness, chills, tremors; admitted for management of acute respiratory failure, AKI and sepsis related to influenza A ? ?  ?PT Comments  ? ? OOB with ease.  Stands with RW and is able to complete x 3 laps on small pod unit with overall improved gait.  He does have a RW available at home and is encouraged to use it initially with +1 from wife until he feels confident.  Pt is making good strides in mobility. ?  ?Recommendations for follow up therapy are one component of a multi-disciplinary discharge planning process, led by the attending physician.  Recommendations may be updated based on patient status, additional functional criteria and insurance authorization. ? ?Follow Up Recommendations ? Home health PT ?  ?  ?Assistance Recommended at Discharge PRN  ?Patient can return home with the following A little help with walking and/or transfers;A little help with bathing/dressing/bathroom;Help with stairs or ramp for entrance ?  ?Equipment Recommendations ?    ?  ?Recommendations for Other Services   ? ? ?  ?Precautions / Restrictions Precautions ?Precautions: Fall ?Restrictions ?Weight Bearing Restrictions: No  ?  ? ?Mobility ? Bed Mobility ?Overal bed mobility: Modified Independent ?  ?  ?  ?  ?  ?  ?  ?  ? ?Transfers ?Overall transfer level: Needs assistance ?Equipment used: Rolling walker (2 wheels) ?Transfers: Sit to/from Stand ?Sit to Stand: Min guard ?  ?  ?  ?  ?  ?  ?  ? ?Ambulation/Gait ?Ambulation/Gait assistance: Min guard ?Gait Distance (Feet): 180 Feet ?Assistive device: Rolling walker (2 wheels) ?Gait Pattern/deviations: Step-through pattern, Decreased step length - right, Decreased step length - left ?  ?  ?  ?General Gait Details: Significanlty improved gait today.  still recommend RW at home which he has  available and +1 from wife intially. ? ? ?Stairs ?  ?  ?  ?  ?  ? ? ?Wheelchair Mobility ?  ? ?Modified Rankin (Stroke Patients Only) ?  ? ? ?  ?Balance Overall balance assessment: Needs assistance ?Sitting-balance support: No upper extremity supported, Feet supported ?Sitting balance-Leahy Scale: Good ?  ?  ?Standing balance support: Bilateral upper extremity supported ?Standing balance-Leahy Scale: Fair ?  ?  ?  ?  ?  ?  ?  ?  ?  ?  ?  ?  ?  ? ?  ?Cognition Arousal/Alertness: Awake/alert ?Behavior During Therapy: [facility] for tasks assessed/performed ?Overall Cognitive Status: Within Functional Limits for tasks assessed ?  ?  ?  ?  ?  ?  ?  ?  ?  ?  ?  ?  ?  ?  ?  ?  ?  ?  ?  ? ?  ?Exercises   ? ?  ?General Comments   ?  ?  ? ?Pertinent Vitals/Pain Pain Assessment ?Pain Assessment: No/denies pain  ? ? ?Home Living   ?  ?  ?  ?  ?  ?  ?  ?  ?  ?   ?  ?Prior Function    ?  ?  ?   ? ?PT Goals (current goals can now be found in the care plan section) Progress towards PT goals: Progressing toward goals ? ?  ?Frequency ? ? ? Min 2X/week ? ? ? ?  ?PT Plan    ? ? ?  Co-evaluation   ?  ?  ?  ?  ? ?  ?AM-PAC PT "6 Clicks" Mobility   ?Outcome Measure ? Help needed turning from your back to your side while in a flat bed without using bedrails?: None ?Help needed moving from lying on your back to sitting on the side of a flat bed without using bedrails?: None ?Help needed moving to and from a bed to a chair (including a wheelchair)?: A Little ?Help needed standing up from a chair using your arms (e.g., wheelchair or bedside chair)?: A Little ?Help needed to walk in hospital room?: A Little ?Help needed climbing 3-5 steps with a railing? : A Little ?6 Click Score: 20 ? ?  ?End of Session Equipment Utilized During Treatment: Gait belt ?Activity Tolerance: Patient tolerated treatment well ?Patient left: in bed;with call bell/phone within reach;with bed alarm set ?Nurse Communication: Mobility status ?PT Visit Diagnosis: Muscle weakness  (generalized) (M62.81);Difficulty in walking, not elsewhere classified (R26.2) ?  ? ? ?Time: 0920-0929 ?PT Time Calculation (min) (ACUTE ONLY): 9 min ? ?Charges:  $Gait Training: 8-22 mins          ?         Chesley Noon, PTA ?10/05/21, 11:19 AM ? ?

## 2021-10-06 DIAGNOSIS — J101 Influenza due to other identified influenza virus with other respiratory manifestations: Secondary | ICD-10-CM | POA: Diagnosis not present

## 2021-10-06 DIAGNOSIS — A419 Sepsis, unspecified organism: Secondary | ICD-10-CM | POA: Diagnosis not present

## 2021-10-06 DIAGNOSIS — R6520 Severe sepsis without septic shock: Secondary | ICD-10-CM | POA: Diagnosis not present

## 2021-10-06 DIAGNOSIS — J9601 Acute respiratory failure with hypoxia: Secondary | ICD-10-CM | POA: Diagnosis not present

## 2021-10-06 LAB — COMPREHENSIVE METABOLIC PANEL
ALT: 341 U/L — ABNORMAL HIGH (ref 0–44)
AST: 193 U/L — ABNORMAL HIGH (ref 15–41)
Albumin: 3.3 g/dL — ABNORMAL LOW (ref 3.5–5.0)
Alkaline Phosphatase: 47 U/L (ref 38–126)
Anion gap: 10 (ref 5–15)
BUN: 28 mg/dL — ABNORMAL HIGH (ref 8–23)
CO2: 23 mmol/L (ref 22–32)
Calcium: 8.4 mg/dL — ABNORMAL LOW (ref 8.9–10.3)
Chloride: 104 mmol/L (ref 98–111)
Creatinine, Ser: 1.26 mg/dL — ABNORMAL HIGH (ref 0.61–1.24)
GFR, Estimated: 58 mL/min — ABNORMAL LOW (ref >60)
Glucose, Bld: 164 mg/dL — ABNORMAL HIGH (ref 70–99)
Potassium: 4.4 mmol/L (ref 3.5–5.1)
Sodium: 137 mmol/L (ref 135–145)
Total Bilirubin: 1.1 mg/dL (ref 0.3–1.2)
Total Protein: 6.8 g/dL (ref 6.5–8.1)

## 2021-10-06 LAB — CBC
HCT: 36.7 % — ABNORMAL LOW (ref 39.0–52.0)
Hemoglobin: 11.6 g/dL — ABNORMAL LOW (ref 13.0–17.0)
MCH: 29 pg (ref 26.0–34.0)
MCHC: 31.6 g/dL (ref 30.0–36.0)
MCV: 91.8 fL (ref 80.0–100.0)
Platelets: 150 10*3/uL (ref 150–400)
RBC: 4 MIL/uL — ABNORMAL LOW (ref 4.22–5.81)
RDW: 15 % (ref 11.5–15.5)
WBC: 8.2 10*3/uL (ref 4.0–10.5)
nRBC: 0 % (ref 0.0–0.2)

## 2021-10-06 LAB — GLUCOSE, CAPILLARY
Glucose-Capillary: 136 mg/dL — ABNORMAL HIGH (ref 70–99)
Glucose-Capillary: 176 mg/dL — ABNORMAL HIGH (ref 70–99)
Glucose-Capillary: 143 mg/dL — ABNORMAL HIGH (ref 70–99)
Glucose-Capillary: 228 mg/dL — ABNORMAL HIGH (ref 70–99)

## 2021-10-06 MED ORDER — KETOROLAC TROMETHAMINE 0.5 % OP SOLN
1.0000 [drp] | Freq: Four times a day (QID) | OPHTHALMIC | Status: DC
  Administered 2021-10-06 – 2021-10-09 (×10): 1 [drp] via OPHTHALMIC
  Filled 2021-10-06: qty 3

## 2021-10-06 MED ORDER — PREDNISOLONE ACETATE 1 % OP SUSP
1.0000 [drp] | Freq: Two times a day (BID) | OPHTHALMIC | Status: DC
  Administered 2021-10-06 – 2021-10-09 (×6): 1 [drp] via OPHTHALMIC
  Filled 2021-10-06: qty 1

## 2021-10-06 MED ORDER — ALBUTEROL SULFATE (2.5 MG/3ML) 0.083% IN NEBU
2.5000 mg | INHALATION_SOLUTION | Freq: Three times a day (TID) | RESPIRATORY_TRACT | Status: DC
Start: 2021-10-06 — End: 2021-10-09
  Administered 2021-10-06 – 2021-10-09 (×10): 2.5 mg via RESPIRATORY_TRACT
  Filled 2021-10-06 (×10): qty 3

## 2021-10-06 NOTE — Progress Notes (Signed)
Pls send Diet [employer] per pt's request. Thank You. ?

## 2021-10-06 NOTE — Progress Notes (Signed)
PT Cancellation Note ? ?Patient Details ?Name: Marvin Carrillo ?MRN: [id number] ?DOB: [birth year] ? ? ?Cancelled Treatment:    Reason Eval/Treat Not Completed: Patient declined, no reason specified ?Entered pt room with offer to work with PT.  He was laying back in bed, reported feeling miserable and stated that he couldn't do more activity today (apparently did walk some with nursing).  He said sorry and then asked PT to see if his nurse can get him some meds.  Deferred PT this date per pt request, will maintain on caseload and continue to follow as appropriate.  ? ?Malachi Pro, DPT ?10/06/2021, 6:33 PM ?

## 2021-10-06 NOTE — Progress Notes (Addendum)
? ? ? ?Progress Note  ? ? ?Marvin Carrillo  [id number] DOB: 1940/06/11  DOA: 10/03/2021 ?PCP: Almyra Free, MD  ? ? ? ? ?Brief Narrative:  ? ? ?Medical records reviewed and are as summarized below: ? ?Marvin Carrillo is a 81 y.o. male with medical history significant for type II DM, hypertension, hyperlipidemia, paroxysmal atrial fibrillation on Eliquis, CKD stage II, chronic systolic CHF, CAD, who presented to the hospital with fever, chills and tremors. ? ?He was admitted to the hospital for severe sepsis secondary to influenza A infection complicated by acute hypoxic respiratory failure and AKI. ? ? ? ? ?Assessment/Plan:  ? ?Principal Problem: ?  Severe sepsis ([place]) ?Active Problems: ?  Influenza A ?  Acute respiratory failure with hypoxia ([place]) ?  AKI (acute kidney injury) ([place]) ?  Type 2 diabetes mellitus ([place]) ?  Anemia ?  Essential hypertension ?  BPH with obstruction/lower urinary tract symptoms ?  Chronic ischemic heart disease ?  CKD (chronic kidney disease) stage 2, GFR 60-89 ml/min ?  Heart failure with reduced ejection fraction ([place]) ?  Paroxysmal atrial fibrillation (H[facility]) ?  Sleep disorder ?  Chills ? ? ? ?Body mass index is 24.39 kg/m?. ? ? ?Severe sepsis secondary to influenza A infection: Continue Tamiflu and antitussives ? ?Acute hypoxic respiratory failure: Improved ? ?AKI on CKD stage II: Creatinine is around baseline. ? ?Thrombocytopenia and elevated liver enzymes: Platelet count has normalized.  Liver enzymes are trending down.  Continue to monitor. ? ?Generalized weakness: PT recommended home health therapy.  Encourage ambulation with assistance today to avoid deconditioning. ? ?Paroxysmal atrial fibrillation: Continue Eliquis and amiodarone. ? ?Chronic systolic CHF: Compensated.  Hold Entresto because of AKI.  2D echo in August 2020 showed EF estimated at 35 to 40%. ? ?Type II DM: Hold [place].  NovoLog as needed for hyperglycemia. ? ?Diet Order   ? ?       ?  Diet heart healthy/carb modified Room  service appropriate? Yes; Fluid consistency: Thin  Diet effective now       ?  ? ?  ?  ? ?  ? ? ? ? ? ? ? ? ?Consultants: ?None ? ?Procedures: ?None ? ? ? ?Medications:  ? ? albuterol  2.5 mg Nebulization TID  ? amiodarone  200 mg Oral Daily  ? apixaban  5 mg Oral BID  ? atorvastatin  80 mg Oral Daily  ? docusate sodium  100 mg Oral BID  ? insulin aspart  0-9 Units Subcutaneous TID WC  ? oseltamivir  30 mg Oral BID  ? ?Continuous Infusions: ? ? ? ? ?Anti-infectives (From admission, onward)  ? ? Start     Dose/Rate Route Frequency Ordered Stop  ? 10/04/21 1000  oseltamivir (TAMIFLU) capsule 30 mg       ? 30 mg Oral 2 times daily 10/03/21 1959 10/09/21 0959  ? 10/04/21 0200  Ampicillin-Sulbactam (UNASYN) 3 g in sodium chloride 0.9 % 100 mL IVPB  Status:  Discontinued       ? 3 g ?200 mL/hr over 30 Minutes Intravenous Every 6 hours 10/04/21 0110 10/05/21 0737  ? 10/03/21 2015  oseltamivir (TAMIFLU) capsule 75 mg       ? 75 mg Oral  Once 10/03/21 1953 10/03/21 2029  ? ?  ? ? ? ? ? ? ? ? ? ?Family Communication/Anticipated D/C date and plan/Code Status  ? ?DVT prophylaxis:  ?apixaban (ELIQUIS) tablet 5 mg  ? ?  Code Status: Full  Code ? ?Family Communication: Plan discussed with his wife at the bedside ?Disposition Plan: Plan to discharge home tomorrow ? ? ?Status is: Inpatient ?Remains inpatient appropriate because: Generalized weakness and feeling poorly ? ? ? ? ? ? ? ? ?Subjective:  ? ?Interval events noted.  He said he had a rough night.  He complains of frequent cough and generalized weakness.  He said he could not sleep last night because of frequent coughing spells.  He feels worse than he did yesterday.  He requested "breathing treatment" because it helps.  His wife was at the bedside. ? ?Objective:  ? ? ?Vitals:  ? 10/05/21 2018 10/06/21 0628 10/06/21 0845 10/06/21 1000  ?BP: 138/65 (!) 145/70 140/69   ?Pulse: (!) 58 61 61   ?Resp: 20 17    ?Temp: 99 ?F (37.2 ?C) 98 ?F (36.7 ?C) 99.8 ?F (37.7 ?C) 98.2 ?F (36.8  ?C)  ?TempSrc:  Oral Oral Oral  ?SpO2: 94% 90% 92%   ?Weight:      ?Height:      ? ?No data found. ? ? ?Intake/Output Summary (Last 24 hours) at 10/06/2021 1400 ?Last data filed at 10/05/2021 1800 ?Gross per 24 hour  ?Intake 300 ml  ?Output --  ?Net 300 ml  ? ?Filed Weights  ? 10/03/21 1704  ?Weight: 77.1 kg  ? ? ?Exam: ? ?GEN: NAD ?SKIN: No rash ?EYES: EOMI ?ENT: MMM ?CV: RRR ?PULM: CTA B ?ABD: soft, ND, NT, +BS ?CNS: AAO x 3, non focal ?EXT: No edema or tenderness ? ? ? ?  ? ? ?Data Reviewed:  ? ?I have personally reviewed following labs and imaging studies: ? ?Labs: ?Labs show the following:  ? ?Basic Metabolic Panel: ?Recent Labs  ?Lab 10/03/21 ?1726 10/04/21 ?[id number] 10/05/21 ?0330 10/06/21 ?0251  ?NA 139 141 141 137  ?K 4.2 4.2 3.9 4.4  ?CL 104 111 107 104  ?CO2 21* 22 25 23   ?GLUCOSE 234* 129* 121* 164*  ?BUN 25* 24* 25* 28*  ?CREATININE 1.70* 1.30* 1.34* 1.26*  ?CALCIUM 9.0 8.4* 8.1* 8.4*  ? ?GFR ?Estimated Creatinine Clearance: 48.3 mL/min (A) (by C-G formula based on SCr of 1.26 mg/dL (H)). ?Liver Function Tests: ?Recent Labs  ?Lab 10/03/21 ?1726 10/04/21 ?[id number] 10/05/21 ?0330 10/06/21 ?0251  ?AST 36 113* 330* 193*  ?ALT 31 124* 394* 341*  ?ALKPHOS [id number]  ?BILITOT 0.6 0.6 0.6 1.1  ?PROT 7.9 7.0 6.2* 6.8  ?ALBUMIN 4.2 3.7 3.1* 3.3*  ? ?Recent Labs  ?Lab 10/03/21 ?1726  ?LIPASE 42  ? ?No results for input(s): AMMONIA in the last 168 hours. ?Coagulation profile ?Recent Labs  ?Lab 10/03/21 ?1726  ?INR 1.2  ? ? ?CBC: ?Recent Labs  ?Lab 10/03/21 ?1726 10/04/21 ?[id number] 10/05/21 ?0330 10/06/21 ?0251  ?WBC 7.8 7.3 8.7 8.2  ?HGB 13.1 12.6* 11.7* 11.6*  ?HCT 42.4 39.4 37.3* 36.7*  ?MCV 95.1 92.9 94.0 91.8  ?PLT 159 143* 142* 150  ? ?Cardiac Enzymes: ?No results for input(s): CKTOTAL, CKMB, CKMBINDEX, TROPONINI in the last 168 hours. ?BNP (last 3 results) ?No results for input(s): PROBNP in the last 8760 hours. ?CBG: ?Recent Labs  ?Lab 10/05/21 ?1151 10/05/21 ?1810 10/05/21 ?2104 10/06/21 ?[id number] 10/06/21 ?1217  ?GLUCAP  181* 156* 154* 136* 176*  ? ?D-Dimer: ?No results for input(s): DDIMER in the last 72 hours. ?Hgb A1c: ?Recent Labs  ?  10/03/21 ?1726  ?HGBA1C 7.4*  ? ?Lipid Profile: ?No results for input(s): CHOL, HDL, LDLCALC, TRIG, CHOLHDL, LDLDIRECT in the last  72 hours. ?Thyroid function studies: ?No results for input(s): TSH, T4TOTAL, T3FREE, THYROIDAB in the last 72 hours. ? ?Invalid input(s): FREET3 ?Anemia work up: ?No results for input(s): VITAMINB12, FOLATE, FERRITIN, TIBC, IRON, RETICCTPCT in the last 72 hours. ?Sepsis Labs: ?Recent Labs  ?Lab 10/03/21 ?1726 10/03/21 ?2027 10/04/21 ?[id number] 10/05/21 ?0330 10/06/21 ?0251  ?PROCALCITON  --   --  0.29  --   --   ?WBC 7.8  --  7.3 8.7 8.2  ?LATICACIDVEN 3.5* 1.8  --   --   --   ? ? ?Microbiology ?Recent Results (from the past 240 hour(s))  ?Culture, blood (Routine x 2)     Status: None (Preliminary result)  ? Collection Time: 10/03/21  5:26 PM  ? Specimen: BLOOD  ?Result Value Ref Range Status  ? Specimen Description BLOOD BOTTLES DRAWN AEROBIC AND ANAEROBIC  Final  ? Special Requests   Final  ?  Blood Culture results may not be optimal due to an inadequate volume of blood received in culture bottles BLOOD RIGHT HAND  ? Culture   Final  ?  NO GROWTH 2 DAYS ?Performed at [facility], [address], [place], [place] [address] ?  ? Report Status PENDING  Incomplete  ?Culture, blood (Routine x 2)     Status: None (Preliminary result)  ? Collection Time: 10/03/21  5:26 PM  ? Specimen: BLOOD  ?Result Value Ref Range Status  ? Specimen Description BLOOD BOTTLES DRAWN AEROBIC AND ANAEROBIC  Final  ? Special Requests Blood Culture adequate volume BLOOD LEFT HAND  Final  ? Culture   Final  ?  NO GROWTH 2 DAYS ?Performed at [facility], [address] [place], [place] [address] ?  ? Report Status PENDING  Incomplete  ?Resp Panel by RT-PCR (Flu A&B, Covid) Nasopharyngeal Swab     Status: Abnormal  ? Collection Time: 10/03/21  6:28 PM  ? Specimen: Nasopharyngeal  Swab; Nasopharyngeal(NP) swabs in vial transport medium  ?Result Value Ref Range Status  ? SARS Coronavirus 2 by RT PCR NEGATIVE NEGATIVE Final  ?  Comment: (NOTE) ?SARS-CoV-2 target nucleic acids are NOT DE

## 2021-10-07 ENCOUNTER — Inpatient Hospital Stay: Payer: Medicare PPO

## 2021-10-07 DIAGNOSIS — J189 Pneumonia, unspecified organism: Secondary | ICD-10-CM | POA: Diagnosis not present

## 2021-10-07 DIAGNOSIS — J188 Other pneumonia, unspecified organism: Secondary | ICD-10-CM | POA: Diagnosis not present

## 2021-10-07 DIAGNOSIS — N1831 Chronic kidney disease, stage 3a: Secondary | ICD-10-CM

## 2021-10-07 DIAGNOSIS — J9601 Acute respiratory failure with hypoxia: Secondary | ICD-10-CM | POA: Diagnosis not present

## 2021-10-07 DIAGNOSIS — J101 Influenza due to other identified influenza virus with other respiratory manifestations: Secondary | ICD-10-CM | POA: Diagnosis not present

## 2021-10-07 DIAGNOSIS — N179 Acute kidney failure, unspecified: Secondary | ICD-10-CM | POA: Diagnosis not present

## 2021-10-07 DIAGNOSIS — A419 Sepsis, unspecified organism: Secondary | ICD-10-CM | POA: Diagnosis not present

## 2021-10-07 LAB — GLUCOSE, CAPILLARY
Glucose-Capillary: 168 mg/dL — ABNORMAL HIGH (ref 70–99)
Glucose-Capillary: 159 mg/dL — ABNORMAL HIGH (ref 70–99)
Glucose-Capillary: 164 mg/dL — ABNORMAL HIGH (ref 70–99)
Glucose-Capillary: 184 mg/dL — ABNORMAL HIGH (ref 70–99)

## 2021-10-07 LAB — HEPATIC FUNCTION PANEL
ALT: 210 U/L — ABNORMAL HIGH (ref 0–44)
AST: 78 U/L — ABNORMAL HIGH (ref 15–41)
Albumin: 3 g/dL — ABNORMAL LOW (ref 3.5–5.0)
Alkaline Phosphatase: 39 U/L (ref 38–126)
Bilirubin, Direct: 0.4 mg/dL — ABNORMAL HIGH (ref 0.0–0.2)
Indirect Bilirubin: 1.1 mg/dL — ABNORMAL HIGH (ref 0.3–0.9)
Total Bilirubin: 1.5 mg/dL — ABNORMAL HIGH (ref 0.3–1.2)
Total Protein: 6.2 g/dL — ABNORMAL LOW (ref 6.5–8.1)

## 2021-10-07 LAB — MRSA NEXT GEN BY PCR, NASAL: MRSA by PCR Next Gen: NOT DETECTED

## 2021-10-07 MED ORDER — SODIUM CHLORIDE 0.9 % IV SOLN
2.0000 g | Freq: Two times a day (BID) | INTRAVENOUS | Status: DC
  Administered 2021-10-07 – 2021-10-09 (×5): 2 g via INTRAVENOUS
  Filled 2021-10-07: qty 12.5
  Filled 2021-10-07: qty 2
  Filled 2021-10-07 (×2): qty 12.5
  Filled 2021-10-07 (×2): qty 2

## 2021-10-07 MED ORDER — LACTATED RINGERS IV SOLN: INTRAVENOUS | Status: DC

## 2021-10-07 MED ORDER — VANCOMYCIN HCL 1750 MG/350ML IV SOLN
1750.0000 mg | Freq: Once | INTRAVENOUS | Status: AC
  Administered 2021-10-07: 1750 mg via INTRAVENOUS
  Filled 2021-10-07: qty 350

## 2021-10-07 MED ORDER — VANCOMYCIN HCL 1250 MG/250ML IV SOLN
1250.0000 mg | INTRAVENOUS | Status: DC
Start: 2021-10-08 — End: 2021-10-08
  Filled 2021-10-07: qty 250

## 2021-10-07 NOTE — Progress Notes (Signed)
Physical Therapy Treatment ?Patient Details ?Name: Marvin Carrillo ?MRN: [id number] ?DOB: 1940-12-07 ?Today's Date: 10/07/2021 ? ? ?History of Present Illness 81 y/o presented to ER secondary to acute onset of weakness, chills, tremors; admitted for management of acute respiratory failure, AKI and sepsis related to influenza A ? ?  ?PT Comments  ? ? Pt sitting up in bed on arrival, family in room and supportive t/o.  Pt was able to do a few bouts of ambulation.  He initially reported feeling weak enough he wanted to use a RW, we did on the first bout of ~125 ft with safe, consistent cadence, but relatively quick to fatigue with O2 dropping to 92% on room air and HR 110s.  After rest break we felt motivated to ambulate w/o walker and managed ~250 ft w/o AD, had no overt safety issues or LOBs, maintained consistent community appropriate speed and on return to sitting showed less severe swing in vitals as per first effort.    ?Recommendations for follow up therapy are one component of a multi-disciplinary discharge planning process, led by the attending physician.  Recommendations may be updated based on patient status, additional functional criteria and insurance authorization. ? ?Follow Up Recommendations ? Home health PT ?  ?  ?Assistance Recommended at Discharge PRN  ?Patient can return home with the following Assist for transportation ?  ?Equipment Recommendations ? None recommended by PT  ?  ?Recommendations for Other Services   ? ? ?  ?Precautions / Restrictions Precautions ?Precautions: Fall ?Restrictions ?Weight Bearing Restrictions: No  ?  ? ?Mobility ? Bed Mobility ?  ?  ?  ?  ?  ?  ?  ?General bed mobility comments: Pt in recliner per and post session, ?  ? ?Transfers ?Overall transfer level: Independent ?Equipment used: None ?  ?Sit to Stand: Modified independent (Device/Increase time) ?  ?  ?  ?  ?  ?General transfer comment: b/l UEs to rise w/o heavy reliance, able to maintain standing with confidence and  safety ?  ? ?Ambulation/Gait ?Ambulation/Gait assistance: Supervision ?Gait Distance (Feet): 250 Feet ?Assistive device: Rolling walker (2 wheels), None ?  ?  ?  ?  ?General Gait Details: first bout of ambulation ~125 ft with FWW (minimal reliance/WBing).  Pt able to ambulate with appropriate speed and cadence, but feeling fatigued after ~100 ft and we turned back into his room for seated rest break. O2 down to 92% (on RA), HR 110s.  Pt then able to ambulate ~250 ft w/o AD and with confident, safe cadence.  some fatigue but less on this second effort with vitals better after the second effort than first. ? ? ?Stairs ?  ?  ?  ?  ?  ? ? ?Wheelchair Mobility ?  ? ?Modified Rankin (Stroke Patients Only) ?  ? ? ?  ?Balance Overall balance assessment: Modified Independent ?  ?Sitting balance-Leahy Scale: Good ?  ?  ?  ?Standing balance-Leahy Scale: Good ?  ?  ?  ?  ?  ?  ?  ?  ?  ?  ?  ?  ?  ? ?  ?Cognition Arousal/Alertness: Awake/alert ?Behavior During Therapy: [facility] for tasks assessed/performed ?Overall Cognitive Status: Within Functional Limits for tasks assessed ?  ?  ?  ?  ?  ?  ?  ?  ?  ?  ?  ?  ?  ?  ?  ?  ?  ?  ?  ? ?  ?Exercises   ? ?  ?  General Comments General comments (skin integrity, edema, etc.): Pt endorses some fatigue with the effort, but does state he is feeling much better than yesterday and ultimately showed abitliy to safely manage in-home distance w/o excessive fatigue, need for UEs or any overt safety issues. ?  ?  ? ?Pertinent Vitals/Pain Pain Assessment ?Pain Assessment:  (general baseline/chronic arthritic joint pain)  ? ? ?Home Living   ?  ?  ?  ?  ?  ?  ?  ?  ?  ?   ?  ?Prior Function    ?  ?  ?   ? ?PT Goals (current goals can now be found in the care plan section) Progress towards PT goals: Progressing toward goals ? ?  ?Frequency ? ? ? Min 2X/week ? ? ? ?  ?PT Plan Current plan remains appropriate  ? ? ?Co-evaluation   ?  ?  ?  ?  ? ?  ?AM-PAC PT "6 Clicks" Mobility   ?Outcome Measure ? Help  needed turning from your back to your side while in a flat bed without using bedrails?: None ?Help needed moving from lying on your back to sitting on the side of a flat bed without using bedrails?: None ?Help needed moving to and from a bed to a chair (including a wheelchair)?: None ?Help needed standing up from a chair using your arms (e.g., wheelchair or bedside chair)?: None ?Help needed to walk in hospital room?: None ?Help needed climbing 3-5 steps with a railing? : A Little ?6 Click Score: 23 ? ?  ?End of Session   ?Activity Tolerance: Patient tolerated treatment well ?Patient left: in bed;with call bell/phone within reach;with bed alarm set ?Nurse Communication: Mobility status ?PT Visit Diagnosis: Muscle weakness (generalized) (M62.81);Difficulty in walking, not elsewhere classified (R26.2) ?  ? ? ?Time: 6734-1937 ?PT Time Calculation (min) (ACUTE ONLY): 16 min ? ?Charges:  $Gait Training: 8-22 mins          ?          ? ?Malachi Pro, DPT ?10/07/2021, 4:36 PM ? ?

## 2021-10-07 NOTE — Progress Notes (Addendum)
? ? ? ?Progress Note  ? ? ?Marvin Carrillo  [id number] DOB: [birth year]  DOA: 10/03/2021 ?PCP: Almyra Free, MD  ? ? ? ? ?Brief Narrative:  ? ? ?Medical records reviewed and are as summarized below: ? ?Marvin Carrillo is a 81 y.o. male with medical history significant for type II DM, hypertension, hyperlipidemia, paroxysmal atrial fibrillation on Eliquis, CKD stage II, chronic systolic CHF, CAD, who presented to the hospital with fever, chills and tremors. ? ?He was admitted to the hospital for severe sepsis secondary to influenza A infection complicated by acute hypoxic respiratory failure and AKI.  His condition took a turn for the worse when he developed fever, worsening generalized weakness and cough productive of bloody sputum.  Repeat chest x-ray showed multifocal pneumonia.  Secondary bacterial pneumonia was suspected.  He was treated with IV vancomycin and cefepime. ? ? ?Assessment/Plan:  ? ?Principal Problem: ?  Severe sepsis ([place]) ?Active Problems: ?  Influenza A ?  Acute respiratory failure with hypoxia ([place]) ?  Multifocal pneumonia ?  AKI (acute kidney injury) ([place]) ?  Type 2 diabetes mellitus ([place]) ?  Anemia ?  Essential hypertension ?  BPH with obstruction/lower urinary tract symptoms ?  Chronic ischemic heart disease ?  Stage 3a chronic kidney disease (CKD) ([place]) ?  Heart failure with reduced ejection fraction ([place]) ?  Paroxysmal atrial fibrillation (HCC) ?  Sleep disorder ?  Chills ? ? ? ?Body mass index is 24.39 kg/m?. ? ? ?Severe sepsis secondary to influenza A infection, likely new secondary bacterial pneumonia: Chest x-ray obtained today showed multifocal pneumonia.  Start IV vancomycin and IV cefepime.  Continue Tamiflu and antitussives. ? ?Acute hypoxic respiratory failure: Improved ? ?AKI on CKD stage IIIa, poor oral intake: Creatinine is around baseline.  Start low rate IV fluids because of poor oral intake. ? ?Thrombocytopenia and elevated liver enzymes: Platelet count has normalized.  Liver enzymes  are trending down.  . ? ?Generalized weakness: PT recommended home health therapy.  Encouraged ambulation as tolerated ? ?Paroxysmal atrial fibrillation: Continue Eliquis and amiodarone. ? ?Chronic systolic CHF: Compensated.  Hold Entresto because of AKI.  2D echo in August 2020 showed EF estimated at 35 to 40%. ? ?Type II DM: Hold [place].  NovoLog as needed for hyperglycemia. ? ? ?Diet Order   ? ?       ?  Diet heart healthy/carb modified Room service appropriate? Yes; Fluid consistency: Thin  Diet effective now       ?  ? ?  ?  ? ?  ? ? ? ? ? ? ? ? ?Consultants: ?None ? ?Procedures: ?None ? ? ? ?Medications:  ? ? albuterol  2.5 mg Nebulization TID  ? amiodarone  200 mg Oral Daily  ? apixaban  5 mg Oral BID  ? atorvastatin  80 mg Oral Daily  ? docusate sodium  100 mg Oral BID  ? insulin aspart  0-9 Units Subcutaneous TID WC  ? ketorolac  1 drop Both Eyes QID  ? oseltamivir  30 mg Oral BID  ? prednisoLONE acetate  1 drop Both Eyes BID  ? ?Continuous Infusions: ? ceFEPime (MAXIPIME) IV    ? lactated ringers 50 mL/hr at 10/07/21 1224  ? [START ON 10/08/2021] vancomycin    ? vancomycin    ? ? ? ? ?Anti-infectives (From admission, onward)  ? ? Start     Dose/Rate Route Frequency Ordered Stop  ? 10/08/21 1400  vancomycin (VANCOREADY) IVPB 1250 mg/250 mL       ?  1,250 mg ?166.7 mL/hr over 90 Minutes Intravenous Every 24 hours 10/07/21 1222    ? 10/07/21 1300  vancomycin (VANCOREADY) IVPB 1750 mg/350 mL       ? 1,750 mg ?175 mL/hr over 120 Minutes Intravenous  Once 10/07/21 1058    ? 10/07/21 1200  ceFEPIme (MAXIPIME) 2 g in sodium chloride 0.9 % 100 mL IVPB       ? 2 g ?200 mL/hr over 30 Minutes Intravenous Every 12 hours 10/07/21 1058    ? 10/04/21 1000  oseltamivir (TAMIFLU) capsule 30 mg       ? 30 mg Oral 2 times daily 10/03/21 1959 10/09/21 0959  ? 10/04/21 0200  Ampicillin-Sulbactam (UNASYN) 3 g in sodium chloride 0.9 % 100 mL IVPB  Status:  Discontinued       ? 3 g ?200 mL/hr over 30 Minutes Intravenous Every 6  hours 10/04/21 0110 10/05/21 0737  ? 10/03/21 2015  oseltamivir (TAMIFLU) capsule 75 mg       ? 75 mg Oral  Once 10/03/21 1953 10/03/21 2029  ? ?  ? ? ? ? ? ? ? ? ? ?Family Communication/Anticipated D/C date and plan/Code Status  ? ?DVT prophylaxis:  ?apixaban (ELIQUIS) tablet 5 mg  ? ?  Code Status: Full Code ? ?Family Communication: Plan discussed with his wife at the bedside, his son, Marvin Carrillo, over the phone ?Disposition Plan: Plan to discharge home in 2 days ? ? ?Status is: Inpatient ?Remains inpatient appropriate because: Pneumonia requiring IV antibiotics ? ? ? ? ? ? ?Subjective:  ? ?Interval events noted.  He complains of cough productive of blood-tinged sputum, generalized weakness.  He had a fever with Tmax of 102.9 ?F overnight.  He also complains of decreased appetite and poor oral intake.  His wife is at the bedside. ? ?Objective:  ? ? ?Vitals:  ? 10/07/21 [id number] 10/07/21 0554 10/07/21 0759 10/07/21 0823  ?BP:  136/78  (!) 153/65  ?Pulse:  (!) 55  63  ?Resp:    18  ?Temp: (!) 100.4 ?F (38 ?C) (!) 97.5 ?F (36.4 ?C)  98.2 ?F (36.8 ?C)  ?TempSrc: Oral   Oral  ?SpO2:  94% 92% 95%  ?Weight:      ?Height:      ? ?No data found. ? ? ?Intake/Output Summary (Last 24 hours) at 10/07/2021 1310 ?Last data filed at 10/07/2021 0120 ?Gross per 24 hour  ?Intake 100 ml  ?Output 450 ml  ?Net -350 ml  ? ?Filed Weights  ? 10/03/21 1704  ?Weight: 77.1 kg  ? ? ?Exam: ? ?GEN: NAD ?SKIN: No rash ?EYES: EOMI ?ENT: MMM ?CV: RRR ?PULM: Bibasilar rales, no wheezing ?ABD: soft, ND, NT, +BS ?CNS: AAO x 3, non focal ?EXT: No edema or tenderness ? ? ? ? ?  ? ? ?Data Reviewed:  ? ?I have personally reviewed following labs and imaging studies: ? ?Labs: ?Labs show the following:  ? ?Basic Metabolic Panel: ?Recent Labs  ?Lab 10/03/21 ?1726 10/04/21 ?[id number] 10/05/21 ?0330 10/06/21 ?0251  ?NA 139 141 141 137  ?K 4.2 4.2 3.9 4.4  ?CL 104 111 107 104  ?CO2 21* 22 25 23   ?GLUCOSE 234* 129* 121* 164*  ?BUN 25* 24* 25* 28*  ?CREATININE 1.70* 1.30* 1.34*  1.26*  ?CALCIUM 9.0 8.4* 8.1* 8.4*  ? ?GFR ?Estimated Creatinine Clearance: 48.3 mL/min (A) (by C-G formula based on SCr of 1.26 mg/dL (H)). ?Liver Function Tests: ?Recent Labs  ?Lab 10/03/21 ?1726 10/04/21 ?[id number] 10/05/21 ?[place]  10/06/21 ?0251 10/07/21 ?0302  ?AST 36 113* 330* 193* 78*  ?ALT 31 124* 394* 341* 210*  ?ALKPHOS [id number]  ?BILITOT 0.6 0.6 0.6 1.1 1.5*  ?PROT 7.9 7.0 6.2* 6.8 6.2*  ?ALBUMIN 4.2 3.7 3.1* 3.3* 3.0*  ? ?Recent Labs  ?Lab 10/03/21 ?1726  ?LIPASE 42  ? ?No results for input(s): AMMONIA in the last 168 hours. ?Coagulation profile ?Recent Labs  ?Lab 10/03/21 ?1726  ?INR 1.2  ? ? ?CBC: ?Recent Labs  ?Lab 10/03/21 ?1726 10/04/21 ?[id number] 10/05/21 ?0330 10/06/21 ?0251  ?WBC 7.8 7.3 8.7 8.2  ?HGB 13.1 12.6* 11.7* 11.6*  ?HCT 42.4 39.4 37.3* 36.7*  ?MCV 95.1 92.9 94.0 91.8  ?PLT 159 143* 142* 150  ? ?Cardiac Enzymes: ?No results for input(s): CKTOTAL, CKMB, CKMBINDEX, TROPONINI in the last 168 hours. ?BNP (last 3 results) ?No results for input(s): PROBNP in the last 8760 hours. ?CBG: ?Recent Labs  ?Lab 10/06/21 ?1217 10/06/21 ?1629 10/06/21 ?2101 10/07/21 ?[id number] 10/07/21 ?1132  ?GLUCAP 176* 143* 228* 168* 159*  ? ?D-Dimer: ?No results for input(s): DDIMER in the last 72 hours. ?Hgb A1c: ?No results for input(s): HGBA1C in the last 72 hours. ? ?Lipid Profile: ?No results for input(s): CHOL, HDL, LDLCALC, TRIG, CHOLHDL, LDLDIRECT in the last 72 hours. ?Thyroid function studies: ?No results for input(s): TSH, T4TOTAL, T3FREE, THYROIDAB in the last 72 hours. ? ?Invalid input(s): FREET3 ?Anemia work up: ?No results for input(s): VITAMINB12, FOLATE, FERRITIN, TIBC, IRON, RETICCTPCT in the last 72 hours. ?Sepsis Labs: ?Recent Labs  ?Lab 10/03/21 ?1726 10/03/21 ?2027 10/04/21 ?[id number] 10/05/21 ?0330 10/06/21 ?0251  ?PROCALCITON  --   --  0.29  --   --   ?WBC 7.8  --  7.3 8.7 8.2  ?LATICACIDVEN 3.5* 1.8  --   --   --   ? ? ?Microbiology ?Recent Results (from the past 240 hour(s))  ?Culture, blood (Routine x 2)      Status: None (Preliminary result)  ? Collection Time: 10/03/21  5:26 PM  ? Specimen: BLOOD  ?Result Value Ref Range Status  ? Specimen Description BLOOD BOTTLES DRAWN AEROBIC AND ANAEROBIC  Final  ?

## 2021-10-07 NOTE — Care Management Important Message (Signed)
Important Message ? ?Patient Details  ?Name: Marvin Carrillo ?MRN: [id number] ?Date of Birth: 02-Feb-1941 ? ? ?Medicare Important Message Given:  Yes ? ?Pt. is in an isolation room so I called his room [phone]). Ended up talking with the patient's wife, Marvin Carrillo. I reviewed the Important Message from Medicare with her but she said doctor had just been in and it may be a couple more days before discharge. If longer than 2 more days will check back in again. I thanked her for her time and wished her husband a quick recovery. ? ? ?Olegario Messier A Adlyn Fife ?10/07/2021, 11:09 AM ?

## 2021-10-07 NOTE — Progress Notes (Signed)
Pharmacy Antibiotic Note ? ?Marvin Carrillo is a 81 y.o. male admitted on 10/03/2021 with pneumonia.  Pharmacy has been consulted for Vancomycin and Cefepime dosing. ? ?-admitted w/ influenza A on 5/13 ?-fever spike 5/17 with [id number] throughout both ?lungs suspicious for atypical multilobar pneumonia.  ?-MRSA PCR ordered ? ?Plan: ?- will order Cefepime 2 gm IV q12h ? ?-will order Loading dose of Vancomycin 1750mg  IV x 1. ?Will continue with Vancomycin 1250 mg IV Q 24 hrs. Goal AUC 400-550. ?Expected AUC: 506 ?SCr used: 1.26  ?Cmin 12.6 ? ?F/u Scr, MRSA PCR ? ? ? ?Height: 5\' 10"  (177.8 cm) ?Weight: 77.1 kg (170 lb) ?IBW/kg (Calculated) : 73 ? ?Temp (24hrs), Avg:99.9 ?F (37.7 ?C), Min:97.5 ?F (36.4 ?C), Max:102.9 ?F (39.4 ?C) ? ?Recent Labs  ?Lab 10/03/21 ?1726 10/03/21 ?2027 10/04/21 ?2028 10/05/21 ?0330 10/06/21 ?0251  ?WBC 7.8  --  7.3 8.7 8.2  ?CREATININE 1.70*  --  1.30* 1.34* 1.26*  ?LATICACIDVEN 3.5* 1.8  --   --   --   ?  ?Estimated Creatinine Clearance: 48.3 mL/min (A) (by C-G formula based on SCr of 1.26 mg/dL (H)).   ? ?No Known Allergies ? ?Antimicrobials this admission: ?Unasyn 5/14>>5/15 ?Cefepime 5/17>> ?Vanc 5/17>> ? ?Dose adjustments this admission: ?  ? ?Microbiology results: ?5/13 BCx: NGx4d ?   UCx:    ?  Sputum:    ?5/17 MRSA PCR: pending ? ?Thank you for allowing pharmacy to be a part of this patient?s care. ? ?Charissa Knowles A ?10/07/2021 12:23 PM ? ?

## 2021-10-08 DIAGNOSIS — J101 Influenza due to other identified influenza virus with other respiratory manifestations: Secondary | ICD-10-CM | POA: Diagnosis not present

## 2021-10-08 DIAGNOSIS — A419 Sepsis, unspecified organism: Secondary | ICD-10-CM | POA: Diagnosis not present

## 2021-10-08 DIAGNOSIS — J9601 Acute respiratory failure with hypoxia: Secondary | ICD-10-CM | POA: Diagnosis not present

## 2021-10-08 DIAGNOSIS — J189 Pneumonia, unspecified organism: Secondary | ICD-10-CM | POA: Diagnosis not present

## 2021-10-08 LAB — COMPREHENSIVE METABOLIC PANEL
ALT: 145 U/L — ABNORMAL HIGH (ref 0–44)
AST: 47 U/L — ABNORMAL HIGH (ref 15–41)
Albumin: 2.8 g/dL — ABNORMAL LOW (ref 3.5–5.0)
Alkaline Phosphatase: 37 U/L — ABNORMAL LOW (ref 38–126)
Anion gap: 8 (ref 5–15)
BUN: 28 mg/dL — ABNORMAL HIGH (ref 8–23)
CO2: 24 mmol/L (ref 22–32)
Calcium: 8.2 mg/dL — ABNORMAL LOW (ref 8.9–10.3)
Chloride: 105 mmol/L (ref 98–111)
Creatinine, Ser: 1.31 mg/dL — ABNORMAL HIGH (ref 0.61–1.24)
GFR, Estimated: 55 mL/min — ABNORMAL LOW (ref >60)
Glucose, Bld: 143 mg/dL — ABNORMAL HIGH (ref 70–99)
Potassium: 4.3 mmol/L (ref 3.5–5.1)
Sodium: 137 mmol/L (ref 135–145)
Total Bilirubin: 1.3 mg/dL — ABNORMAL HIGH (ref 0.3–1.2)
Total Protein: 6.5 g/dL (ref 6.5–8.1)

## 2021-10-08 LAB — PROCALCITONIN: Procalcitonin: 0.35 ng/mL

## 2021-10-08 LAB — CBC
HCT: 32.3 % — ABNORMAL LOW (ref 39.0–52.0)
Hemoglobin: 10.4 g/dL — ABNORMAL LOW (ref 13.0–17.0)
MCH: 29.4 pg (ref 26.0–34.0)
MCHC: 32.2 g/dL (ref 30.0–36.0)
MCV: 91.2 fL (ref 80.0–100.0)
Platelets: 156 10*3/uL (ref 150–400)
RBC: 3.54 MIL/uL — ABNORMAL LOW (ref 4.22–5.81)
RDW: 15 % (ref 11.5–15.5)
WBC: 6.8 10*3/uL (ref 4.0–10.5)
nRBC: 0 % (ref 0.0–0.2)

## 2021-10-08 LAB — CULTURE, BLOOD (ROUTINE X 2)
Culture: NO GROWTH
Culture: NO GROWTH
Special Requests: ADEQUATE

## 2021-10-08 LAB — GLUCOSE, CAPILLARY
Glucose-Capillary: 163 mg/dL — ABNORMAL HIGH (ref 70–99)
Glucose-Capillary: 230 mg/dL — ABNORMAL HIGH (ref 70–99)
Glucose-Capillary: 184 mg/dL — ABNORMAL HIGH (ref 70–99)
Glucose-Capillary: 233 mg/dL — ABNORMAL HIGH (ref 70–99)

## 2021-10-08 NOTE — Progress Notes (Signed)
  Progress Note   Patient: Marvin Carrillo [id number] DOB: May 24, 1941 DOA: 10/03/2021     4 DOS: the patient was seen and examined on 10/08/2021   Brief hospital course: Darla Greenwalt is a 81 y.o. male with medical history significant for type II DM, hypertension, hyperlipidemia, paroxysmal atrial fibrillation on Eliquis, CKD stage II, chronic systolic CHF, CAD, who presented to the hospital with fever, chills and tremors.   He was admitted to the hospital for severe sepsis secondary to influenza A infection complicated by acute hypoxic respiratory failure and AKI.  His condition took a turn for the worse when he developed fever, worsening generalized weakness and cough productive of bloody sputum.  Repeat chest x-ray showed multifocal pneumonia.  Secondary bacterial pneumonia was suspected.  He was treated with IV vancomycin and cefepime.  Assessment and Plan: Severe sepsis. Influenza A infection. Secondary bacterial pneumonia. Patient condition is improving, MRSA culture negative, discontinue vancomycin, continue cefepime for now.  May be able to change to oral antibiotics tomorrow.  Acute hypoxemic respiratory failure. Condition improved  Acute kidney injury on chronic kidney disease stage IIIa. Renal function had improved.  Thrombocytopenia secondary to influenza. Condition improved.  Paroxysmal atrial fibrillation. Chronic systolic congestive heart failure Continue anticoagulation.  Currently patient does not have any volume overload.  Type 2 diabetes. Continue current regimen.  Generalized weakness pain Continue PT/OT.  Most likely discharge home tomorrow with home care.       Subjective: Patient doing better, off oxygen, no short of breath.  Significant weakness.  Physical Exam: Vitals:   10/08/21 0632 10/08/21 0818 10/08/21 1234 10/08/21 1336  BP: (!) 148/66 131/65 139/69   Pulse: (!) 53 (!) 58 60   Resp: 17 16 16    Temp: 98.6 F (37 C) 98.2 F (36.8 C) 98 F (36.7  C)   TempSrc:  Oral Oral   SpO2: 97% 95% 96% 94%  Weight:      Height:       General exam: Appears calm and comfortable  Respiratory system: Clear to auscultation. Respiratory effort normal. Cardiovascular system: S1 & S2 heard, RRR. No JVD, murmurs, rubs, gallops or clicks. No pedal edema. Gastrointestinal system: Abdomen is nondistended, soft and nontender. No organomegaly or masses felt. Normal bowel sounds heard. Central nervous system: Alert and oriented. No focal neurological deficits. Extremities: Symmetric 5 x 5 power. Skin: No rashes, lesions or ulcers Psychiatry: Judgement and insight appear normal. Mood & affect appropriate.   Data Reviewed:  Lab results reviewed.  Family Communication: son updated  Disposition: Status is: Inpatient Remains inpatient appropriate because: severity of disease, iv tx  Planned Discharge Destination: Home with Home Health    Time spent: 28 minutes  Author: Sharen Hones, MD 10/08/2021 2:13 PM  For on call review [email or web address].

## 2021-10-09 DIAGNOSIS — J101 Influenza due to other identified influenza virus with other respiratory manifestations: Secondary | ICD-10-CM | POA: Diagnosis not present

## 2021-10-09 DIAGNOSIS — N179 Acute kidney failure, unspecified: Secondary | ICD-10-CM | POA: Diagnosis not present

## 2021-10-09 DIAGNOSIS — A419 Sepsis, unspecified organism: Secondary | ICD-10-CM | POA: Diagnosis not present

## 2021-10-09 DIAGNOSIS — J9601 Acute respiratory failure with hypoxia: Secondary | ICD-10-CM | POA: Diagnosis not present

## 2021-10-09 LAB — BASIC METABOLIC PANEL
Anion gap: 9 (ref 5–15)
BUN: 28 mg/dL — ABNORMAL HIGH (ref 8–23)
CO2: 23 mmol/L (ref 22–32)
Calcium: 8.2 mg/dL — ABNORMAL LOW (ref 8.9–10.3)
Chloride: 106 mmol/L (ref 98–111)
Creatinine, Ser: 1.18 mg/dL (ref 0.61–1.24)
GFR, Estimated: >60 mL/min (ref >60)
Glucose, Bld: 184 mg/dL — ABNORMAL HIGH (ref 70–99)
Potassium: 4.5 mmol/L (ref 3.5–5.1)
Sodium: 138 mmol/L (ref 135–145)

## 2021-10-09 LAB — MAGNESIUM: Magnesium: 2.5 mg/dL — ABNORMAL HIGH (ref 1.7–2.4)

## 2021-10-09 LAB — GLUCOSE, CAPILLARY: Glucose-Capillary: 197 mg/dL — ABNORMAL HIGH (ref 70–99)

## 2021-10-09 MED ORDER — ALBUTEROL SULFATE HFA 108 (90 BASE) MCG/ACT IN AERS: 2.0000 | INHALATION_SPRAY | Freq: Four times a day (QID) | RESPIRATORY_TRACT | 0 refills | Status: AC | PRN

## 2021-10-09 MED ORDER — CEFDINIR 300 MG PO CAPS: 300.0000 mg | ORAL_CAPSULE | Freq: Two times a day (BID) | ORAL | 0 refills | Status: AC

## 2021-10-09 NOTE — Progress Notes (Signed)
Inpatient Diabetes Program Recommendations  AACE/ADA: New Consensus Statement on Inpatient Glycemic Control   Target Ranges:  Prepandial:   less than 140 mg/dL      Peak postprandial:   less than 180 mg/dL (1-2 hours)      Critically ill patients:  140 - 180 mg/dL    Latest Reference Range & Units 10/08/21 09:04 10/08/21 12:35 10/08/21 16:47 10/08/21 22:14  Glucose-Capillary 70 - 99 mg/dL 163 (H) 230 (H) 184 (H) 233 (H)   Review of Glycemic Control  Diabetes history: DM2 Outpatient Diabetes medications: Farxiga 10 mg daily, Metformin 1000 mg BID Current orders for Inpatient glycemic control: Novolog 0-9 units TID with meals  Inpatient Diabetes Program Recommendations:    Insulin: Please consider ordering Novolog 2 units TID with meals for meal coverage if patient eats at least 50% of meals.  Thanks, Barnie Alderman, RN, MSN, CDE Diabetes Coordinator Inpatient Diabetes Program [phone] (Team Pager from 8am to 5pm)

## 2021-10-09 NOTE — Progress Notes (Signed)
AVS reviewed; appointment scheduled, IV removed. Patient and wife have no questions or concerns at this time. Patient is adequate for discharge and vocalizes understanding of discharge instructions including when to follow up with the PCP or return to ED.

## 2021-10-09 NOTE — Discharge Summary (Signed)
Physician Discharge Summary   Patient: Marvin Carrillo MRN: [id number] DOB: August 13, 1940  Admit date:     10/03/2021  Discharge date: 10/09/21  Discharge Physician: Sharen Hones   PCP: Almyra Free, MD   Recommendations at discharge:   Follow-up with PCP in 1 week.  Discharge Diagnoses: Principal Problem:   Severe sepsis ([place]) Active Problems:   Influenza A   Acute respiratory failure with hypoxia (HCC)   Multifocal pneumonia   AKI (acute kidney injury) ([place])   Type 2 diabetes mellitus (HCC)   Anemia   Essential hypertension   BPH with obstruction/lower urinary tract symptoms   Chronic ischemic heart disease   Stage 3a chronic kidney disease (CKD) (HCC)   Heart failure with reduced ejection fraction (HCC)   Paroxysmal atrial fibrillation ([place])   Sleep disorder   Chills  Resolved Problems:   Chronic obstructive lung disease [facility])  Hospital Course: Marvin Carrillo is a 81 y.o. male with medical history significant for type II DM, hypertension, hyperlipidemia, paroxysmal atrial fibrillation on Eliquis, CKD stage II, chronic systolic CHF, CAD, who presented to the hospital with fever, chills and tremors.   He was admitted to the hospital for severe sepsis secondary to influenza A infection complicated by acute hypoxic respiratory failure and AKI.  His condition took a turn for the worse when he developed fever, worsening generalized weakness and cough productive of bloody sputum.  Repeat chest x-ray showed multifocal pneumonia.  Secondary bacterial pneumonia was suspected.  He was treated with IV vancomycin and cefepime.  Assessment and Plan: Severe sepsis. Influenza A infection. Secondary bacterial pneumonia. Patient condition is improving, MRSA culture negative, treated with cefepime. Condition greatly improved today, will continue oral antibiotic with cefdinir for additional 3 days.  Continue albuterol as needed.  Acute hypoxemic respiratory failure. Condition improved  Acute  kidney injury on chronic kidney disease stage IIIa. Renal function had improved.  Thrombocytopenia secondary to influenza. Condition improved.   Paroxysmal atrial fibrillation. Chronic systolic congestive heart failure Continue anticoagulation.  Currently patient does not have any volume overload. Resume home treatment.  Type 2 diabetes. Continue current regimen.   Generalized weakness  We will set up home PT/OT.          Consultants: None Procedures performed: None  Disposition: Home health Diet recommendation:  Discharge Diet Orders (From admission, onward)     Start     Ordered   10/09/21 0000  Diet - low sodium heart healthy        10/09/21 1042           Cardiac diet DISCHARGE MEDICATION: Allergies as of 10/09/2021   No Known Allergies      Medication List     STOP taking these medications    amLODipine 2.5 MG tablet Commonly known as: NORVASC       TAKE these medications    albuterol 108 (90 Base) MCG/ACT inhaler Commonly known as: VENTOLIN HFA Inhale 2 puffs into the lungs every 6 (six) hours as needed for wheezing or shortness of breath.   amiodarone 200 MG tablet Commonly known as: PACERONE Take 200 mg by mouth daily.   apixaban 5 MG Tabs tablet Commonly known as: ELIQUIS Take 5 mg by mouth 2 (two) times daily.   aspirin EC 81 MG tablet Take 81 mg by mouth at bedtime.   atorvastatin 80 MG tablet Commonly known as: LIPITOR Take 80 mg by mouth daily.   cefdinir 300 MG capsule Commonly known as: OMNICEF Take 1  capsule (300 mg total) by mouth 2 (two) times daily for 3 days.   Entresto 24-26 MG Generic drug: sacubitril-valsartan Take 1 tablet by mouth 2 (two) times daily.   Farxiga 10 MG Tabs tablet Generic drug: dapagliflozin propanediol Take 10 mg by mouth daily.   hydrALAZINE 50 MG tablet Commonly known as: APRESOLINE Take 50 mg by mouth 2 (two) times daily.   isosorbide mononitrate 30 MG 24 hr tablet Commonly known  as: IMDUR Take 30 mg by mouth daily.   metFORMIN 1000 MG tablet Commonly known as: GLUCOPHAGE Take 1,000 mg by mouth 2 (two) times daily.        Follow-up Information     Adele Schilder, MD Follow up in 1 week(s).   Specialty: Family Medicine Contact information: [address] [place] [address] [phone]                Discharge Exam: Ceasar Mons Weights   10/03/21 1704  Weight: 77.1 kg   General exam: Appears calm and comfortable  Respiratory system: Clear to auscultation. Respiratory effort normal. Cardiovascular system: S1 & S2 heard, RRR. No JVD, murmurs, rubs, gallops or clicks. No pedal edema. Gastrointestinal system: Abdomen is nondistended, soft and nontender. No organomegaly or masses felt. Normal bowel sounds heard. Central nervous system: Alert and oriented. No focal neurological deficits. Extremities: Symmetric 5 x 5 power. Skin: No rashes, lesions or ulcers Psychiatry: Judgement and insight appear normal. Mood & affect appropriate.    Condition at discharge: good  The results of significant diagnostics from this hospitalization (including imaging, microbiology, ancillary and laboratory) are listed below for reference.   Imaging Studies: DG Chest 2 View  Result Date: 10/07/2021 CLINICAL DATA:  Fever, pneumonia, sepsis. EXAM: CHEST - 2 VIEW COMPARISON:  Radiographs 10/03/2021. 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 Interval development of heterogeneous airspace opacities throughout both lungs with mild fissural thickening. No confluent airspace opacity, significant pleural effusion or pneumothorax identified. The bones appear unchanged. IMPRESSION: Interval development of heterogeneous opacities throughout both lungs suspicious for atypical multilobar pneumonia. Electronically Signed   By: Carey Bullocks M.D.   On: 10/07/2021 08:59   DG Chest 2 View  Result Date:  10/03/2021 CLINICAL DATA:  Tremors while eating. EXAM: CHEST - 2 VIEW COMPARISON:  None Available. FINDINGS: Multiple sternal wires are seen. The heart size and mediastinal contours are within normal limits. There is no evidence of acute infiltrate, pleural effusion or pneumothorax. Multilevel degenerative changes seen throughout the thoracic spine. IMPRESSION: 1. Evidence of prior median sternotomy. 2. No acute or active cardiopulmonary disease. Electronically Signed   By: Aram Candela M.D.   On: 10/03/2021 17:49   CT HEAD WO CONTRAST ( )  Result Date: 10/03/2021 CLINICAL DATA:  Generalized tremors. EXAM: CT HEAD WITHOUT CONTRAST TECHNIQUE: Contiguous axial images were obtained from the base of the skull through the vertex without intravenous contrast. RADIATION DOSE REDUCTION: This exam was performed according to the departmental dose-optimization program which includes automated exposure control, adjustment of the mA and/or kV according to patient size and/or use of iterative reconstruction technique. COMPARISON:  None Available. FINDINGS: Brain: There is mild cerebral atrophy with widening of the extra-axial spaces and ventricular dilatation. There are areas of decreased attenuation within the white matter tracts of the supratentorial brain, consistent with microvascular disease changes. Vascular: No hyperdense vessel or unexpected calcification. Skull: Normal. Negative for fracture or focal lesion. Sinuses/Orbits: There is moderate severity left  maxillary sinus and bilateral ethmoid sinus mucosal thickening. Other: None. IMPRESSION: 1. No acute intracranial abnormality. 2. Moderate severity left maxillary sinus and bilateral ethmoid sinus disease. Electronically Signed   By: Virgina Norfolk M.D.   On: 10/03/2021 21:59    Microbiology: Results for orders placed or performed during the hospital encounter of 10/03/21  Culture, blood (Routine x 2)     Status: None   Collection Time: 10/03/21  5:26  PM   Specimen: BLOOD  Result Value Ref Range Status   Specimen Description BLOOD BOTTLES DRAWN AEROBIC AND ANAEROBIC  Final   Special Requests   Final    Blood Culture results may not be optimal due to an inadequate volume of blood received in culture bottles BLOOD RIGHT HAND   Culture   Final    NO GROWTH 5 DAYS Performed at [facility], [address] [place], [place] [address]    Report Status 10/08/2021 FINAL  Final  Culture, blood (Routine x 2)     Status: None   Collection Time: 10/03/21  5:26 PM   Specimen: BLOOD  Result Value Ref Range Status   Specimen Description BLOOD BOTTLES DRAWN AEROBIC AND ANAEROBIC  Final   Special Requests Blood Culture adequate volume BLOOD LEFT HAND  Final   Culture   Final    NO GROWTH 5 DAYS Performed at [facility], [address] [place], [place] [address]    Report Status 10/08/2021 FINAL  Final  Resp Panel by RT-PCR (Flu A&B, Covid) Nasopharyngeal Swab     Status: Abnormal   Collection Time: 10/03/21  6:28 PM   Specimen: Nasopharyngeal Swab; Nasopharyngeal(NP) swabs in vial transport medium  Result Value Ref Range Status   SARS Coronavirus 2 by RT PCR NEGATIVE NEGATIVE Final    Comment: (NOTE) SARS-CoV-2 target nucleic acids are NOT DETECTED.  The SARS-CoV-2 RNA is generally detectable in upper respiratory specimens during the acute phase of infection. The lowest concentration of SARS-CoV-2 viral copies this assay can detect is 138 copies/mL. A negative result does not preclude SARS-Cov-2 infection and should not be used as the sole basis for treatment or other patient management decisions. A negative result may occur with  improper specimen collection/handling, submission of specimen other than nasopharyngeal swab, presence of viral mutation(s) within the areas targeted by this assay, and inadequate number of viral copies(<138 copies/mL). A negative result must be combined with clinical observations, patient  history, and epidemiological information. The expected result is Negative.  Fact Sheet for Patients:  EntrepreneurPulse.com.au  Fact Sheet for Healthcare Providers:  IncredibleEmployment.be  This test is no t yet approved or cleared by the [place] FDA and  has been authorized for detection and/or diagnosis of SARS-CoV-2 by FDA under an Emergency Use Authorization (EUA). This EUA will remain  in effect (meaning this test can be used) for the duration of the COVID-19 declaration under Section 564(b)(1) of the Act, 21 U.S.C.section 360bbb-3(b)(1), unless the authorization is terminated  or revoked sooner.       Influenza A by PCR POSITIVE (A) NEGATIVE Final   Influenza B by PCR NEGATIVE NEGATIVE Final    Comment: (NOTE) The Xpert Xpress SARS-CoV-2/FLU/RSV plus assay is intended as an aid in the diagnosis of influenza from Nasopharyngeal swab specimens and should not be used as a sole basis for treatment. Nasal washings and aspirates are unacceptable for Xpert Xpress SARS-CoV-2/FLU/RSV testing.  Fact Sheet for Patients: EntrepreneurPulse.com.au  Fact Sheet for Healthcare Providers: IncredibleEmployment.be  This  test is not yet approved or cleared by the [place] and has been authorized for detection and/or diagnosis of SARS-CoV-2 by FDA under an Emergency Use Authorization (EUA). This EUA will remain in effect (meaning this test can be used) for the duration of the COVID-19 declaration under Section 564(b)(1) of the Act, 21 U.S.C. section 360bbb-3(b)(1), unless the authorization is terminated or revoked.  Performed at [facility], [address] [place], [place] [address]   MRSA Next Gen by PCR, Nasal     Status: None   Collection Time: 10/07/21 12:06 PM   Specimen: Nasal Mucosa; Nasal Swab  Result Value Ref Range Status   MRSA by PCR Next Gen NOT DETECTED NOT DETECTED Final     Comment: (NOTE) The GeneXpert MRSA Assay (FDA approved for NASAL specimens only), is one component of a comprehensive MRSA colonization surveillance program. It is not intended to diagnose MRSA infection nor to guide or monitor treatment for MRSA infections. Test performance is not FDA approved in patients less than 22 years old. Performed at [facility], [address] [place], [place] [address]     Labs: CBC: Recent Labs  Lab 10/03/21 1726 10/04/21 0521 10/05/21 0330 10/06/21 0251 10/08/21 0509  WBC 7.8 7.3 8.7 8.2 6.8  HGB 13.1 12.6* 11.7* 11.6* 10.4*  HCT 42.4 39.4 37.3* 36.7* 32.3*  MCV 95.1 92.9 94.0 91.8 91.2  PLT 159 143* 142* 150 [id number]   Basic Metabolic Panel: Recent Labs  Lab 10/04/21 0521 10/05/21 0330 10/06/21 0251 10/08/21 0509 10/09/21 0330  NA 141 141 137 137 138  K 4.2 3.9 4.4 4.3 4.5  CL 111 107 104 105 106  CO2 22 25 23 24 23   GLUCOSE 129* 121* 164* 143* 184*  BUN 24* 25* 28* 28* 28*  CREATININE 1.30* 1.34* 1.26* 1.31* 1.18  CALCIUM 8.4* 8.1* 8.4* 8.2* 8.2*  MG  --   --   --   --  2.5*   Liver Function Tests: Recent Labs  Lab 10/04/21 0521 10/05/21 0330 10/06/21 0251 10/07/21 0302 10/08/21 0509  AST 113* 330* 193* 78* 47*  ALT 124* 394* 341* 210* 145*  ALKPHOS 54 45 47 39 37*  BILITOT 0.6 0.6 1.1 1.5* 1.3*  PROT 7.0 6.2* 6.8 6.2* 6.5  ALBUMIN 3.7 3.1* 3.3* 3.0* 2.8*   CBG: Recent Labs  Lab 10/08/21 0904 10/08/21 1235 10/08/21 1647 10/08/21 2214 10/09/21 0950  GLUCAP 163* 230* 184* 233* 197*    Discharge time spent: greater than 30 minutes.  Signed: Sharen Hones, MD [facility] 10/09/2021

## 2021-10-09 NOTE — TOC Transition Note (Signed)
Transition of Care [facility]) - CM/SW Discharge Note   Patient Details  Name: Marvin Carrillo MRN: [id number] Date of Birth: 07/23/1940  [facility] CM/SW Contact:  Allayne Butcher, RN Phone Number: 10/09/2021, 12:15 PM   Clinical Narrative:    Patient is medically cleared for discharge home, wife here to transport patient.  Home Health has been arranged for RN, PT, and OT.  [place] notified of discharge today and orders are in.    Final next level of care: Home w Home Health Services Barriers to Discharge: Barriers Resolved   Patient Goals and CMS Choice Patient states their goals for this hospitalization and ongoing recovery are:: to go home CMS Medicare.gov Compare Post Acute Care list provided to:: Patient Choice offered to / list presented to : Patient  Discharge Placement                       Discharge Plan and Services                DME Arranged: N/A DME Agency: NA       HH Arranged: PT, OT, RN HH Agency: [facility] Date [facility] Agency Contacted: 10/09/21 Time HH Agency Contacted: [phone] Representative spoke with at [facility] Agency: [place]  Social Determinants of Health (SDOH) Interventions     Readmission Risk Interventions     View : No data to display.

## 2022-06-25 IMAGING — CT CT HEAD W/O CM
4 series · 17 of 47 positions shown, 19 images · non-contrast
Comparison: None Available.

CLINICAL DATA: Generalized tremors.



[Series 2: head wo · axial · 0.43mm/px · z∈[+385,+510]mm · 7 of 35 slices shown, 9 images]
[im 5/35  brain]
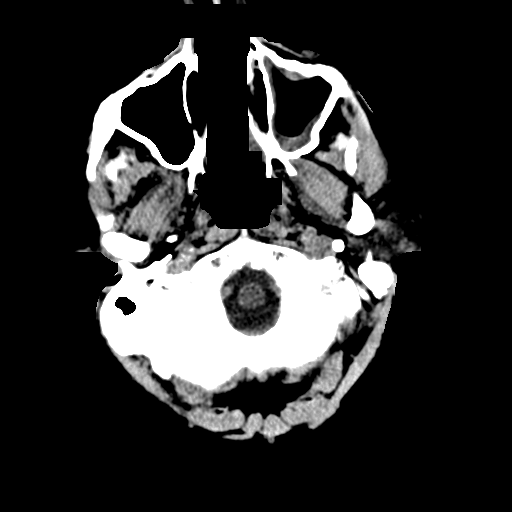
[im 5/35  bone]
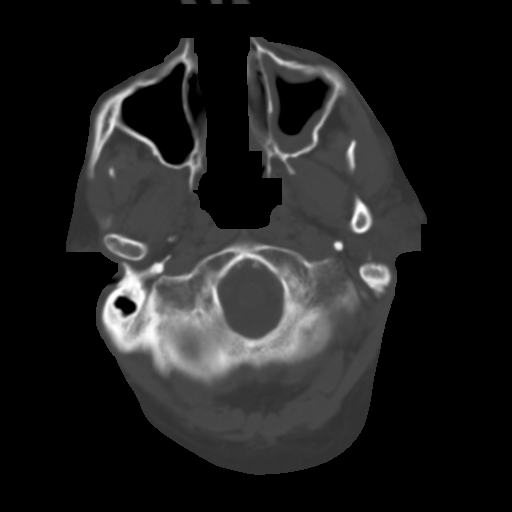
[im 9/35  brain]
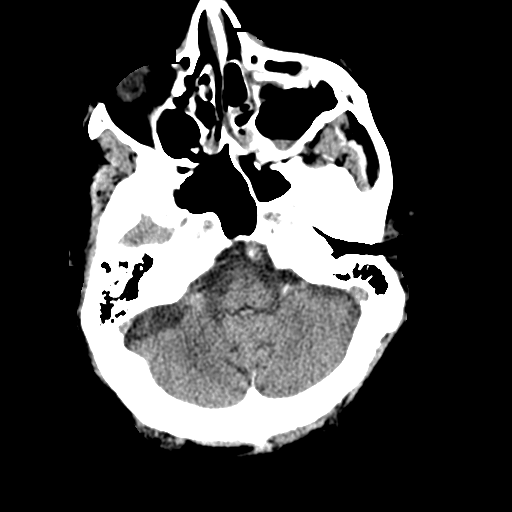
[im 13/35  brain]
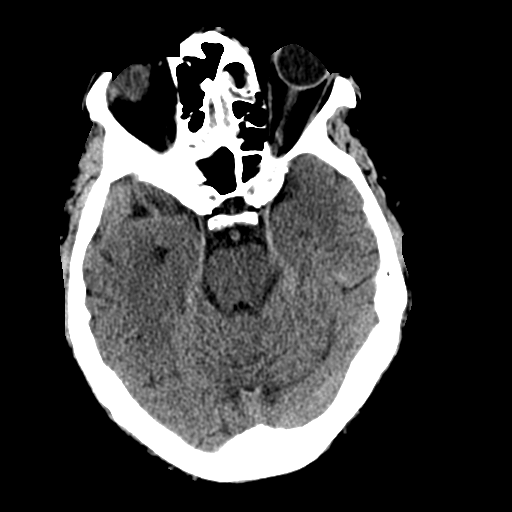
[im 18/35  brain]
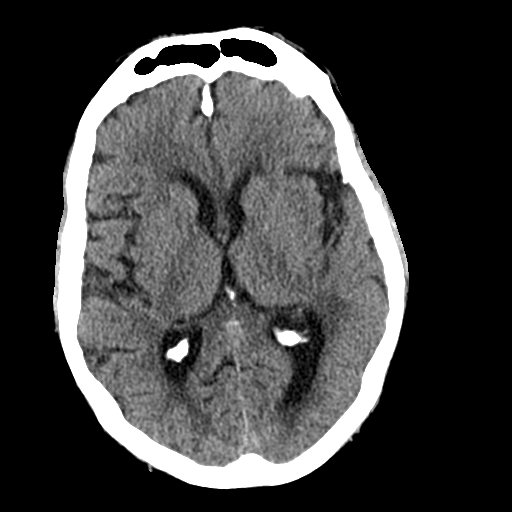
[im 22/35  brain]
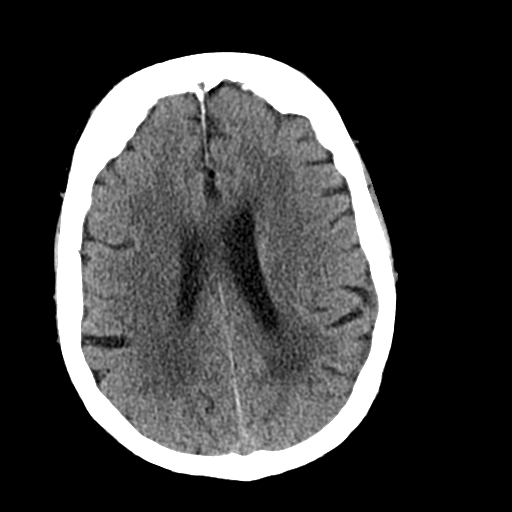
[im 22/35  bone]
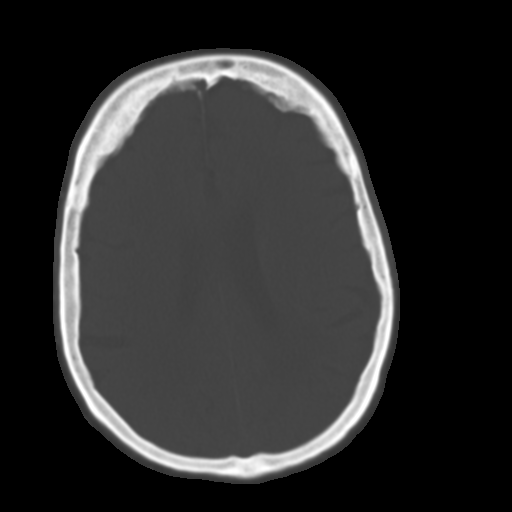
[im 26/35  brain]
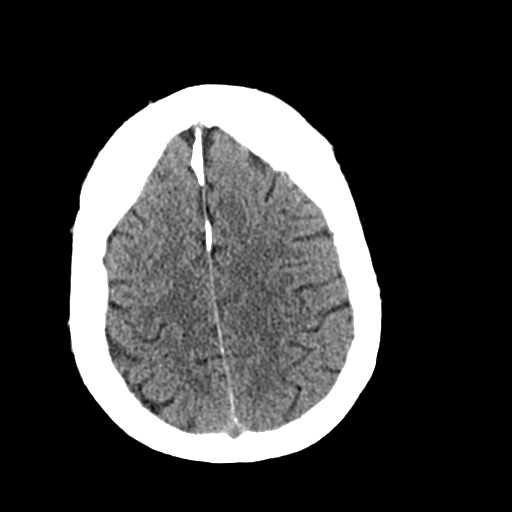
[im 30/35  brain]
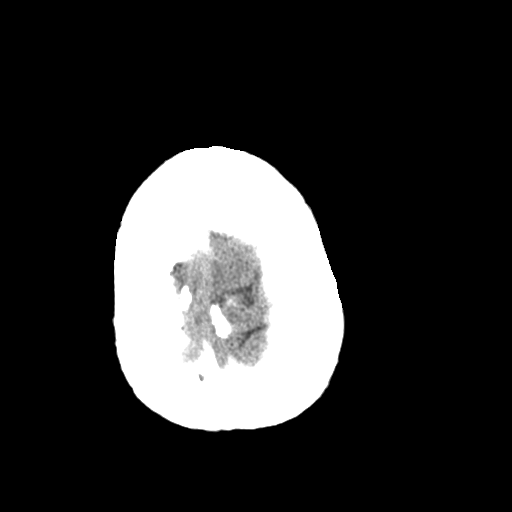

[Series 3: head bone · axial · 0.43mm/px · z∈[+381,+439]mm · 4 of 85 slices shown]
[im 9/85  bone]
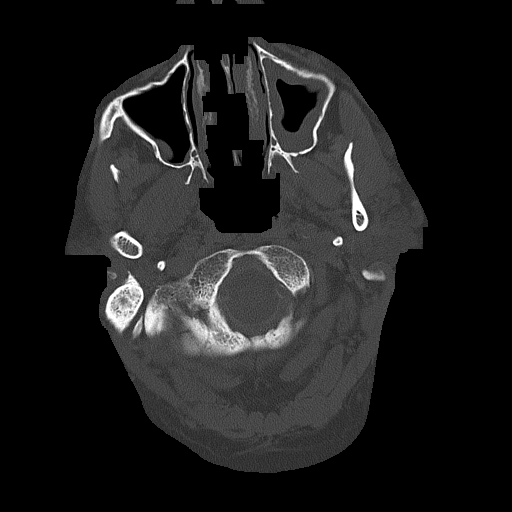
[im 17/85  bone]
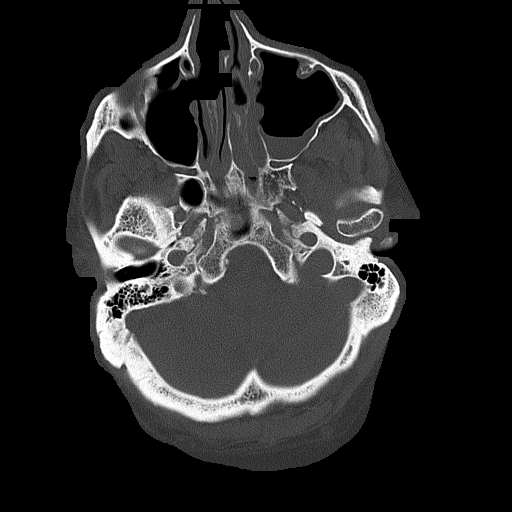
[im 26/85  bone]
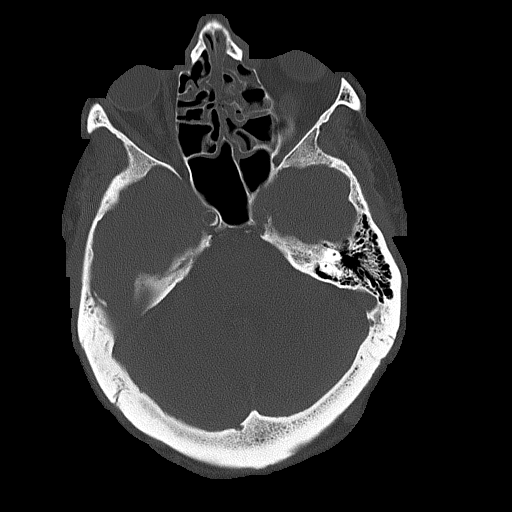
[im 38/85  bone]
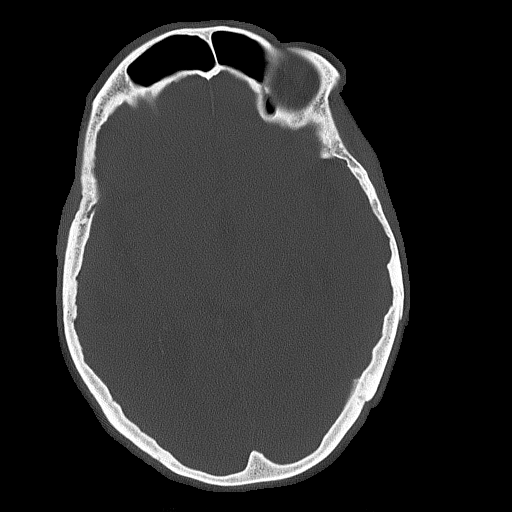

[Series 4: coronal soft tissue · coronal · 0.38mm/px · 3 of 76 slices shown]
[im 26/76  brain]
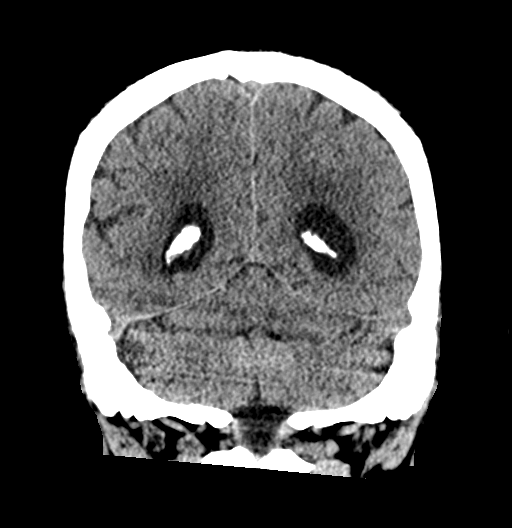
[im 34/76  brain]
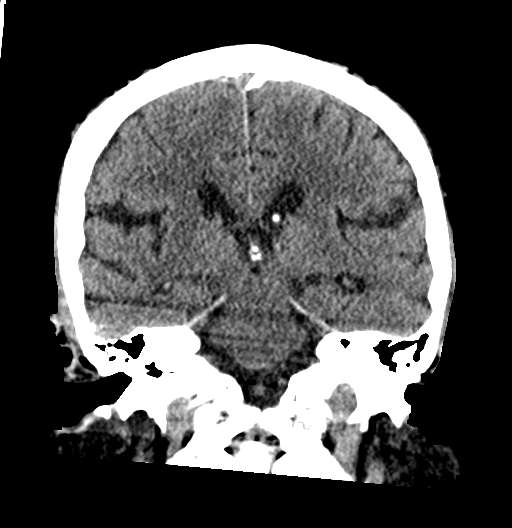
[im 42/76  brain]
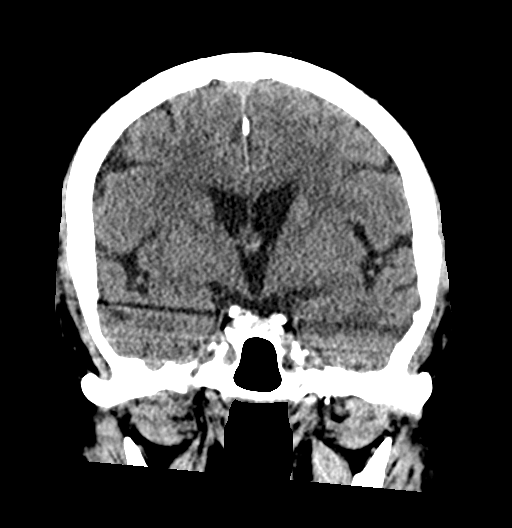

[Series 5: sagittal soft tissue · sagittal · 0.38mm/px · 3 of 61 slices shown]
[im 21/61  brain]
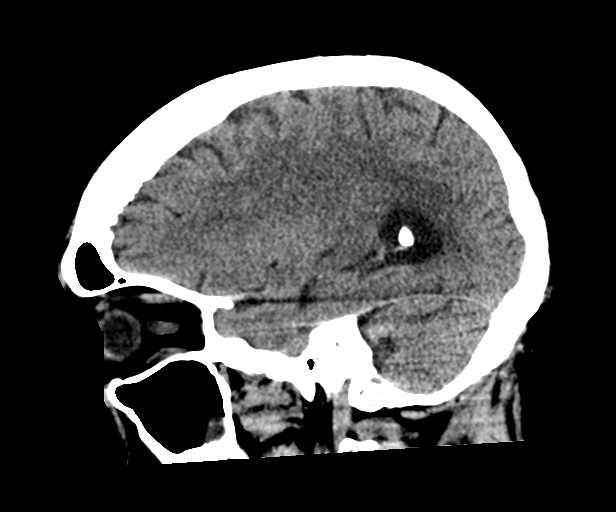
[im 31/61  brain]
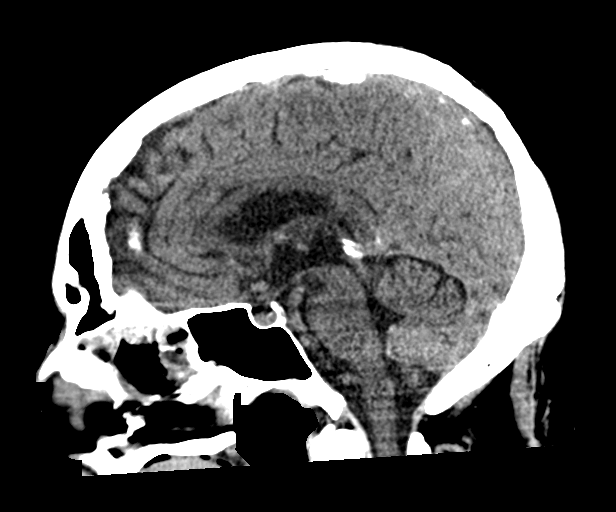
[im 41/61  brain]
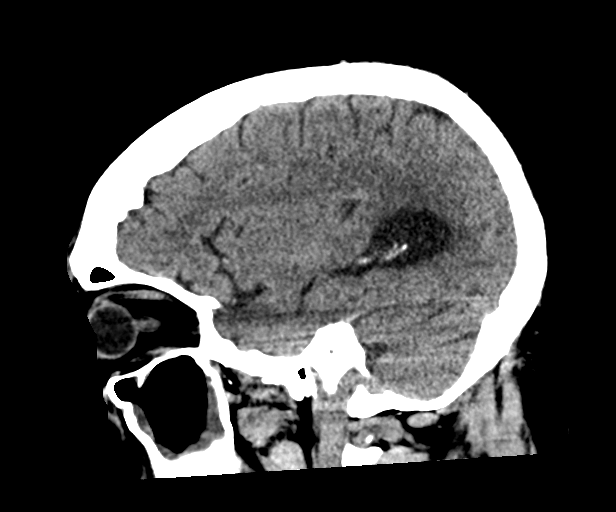

[17 of 47 positions shown; findings below may reference images not displayed]

FINDINGS: Brain: There is mild cerebral atrophy with widening of the
extra-axial spaces and ventricular dilatation.
There are areas of decreased attenuation within the white matter
tracts of the supratentorial brain, consistent with microvascular
disease changes.

Vascular: No hyperdense vessel or unexpected calcification.

Skull: Normal. Negative for fracture or focal lesion.

Sinuses/Orbits: There is moderate severity left maxillary sinus and
bilateral ethmoid sinus mucosal thickening.

Other: None.
IMPRESSION: 1. No acute intracranial abnormality.
2. Moderate severity left maxillary sinus and bilateral ethmoid
sinus disease.

## 2022-06-29 IMAGING — CR DG CHEST 2V
1 series · 2 of 2 positions shown · non-contrast
Comparison: Radiographs 10/03/2021.

CLINICAL DATA: Fever, pneumonia, sepsis.

EXAM:
CHEST - 2 VIEW

[Series 1: dg chest 2 view · 0.14mm/px · 2 of 2 slices shown]
[im 1/2]
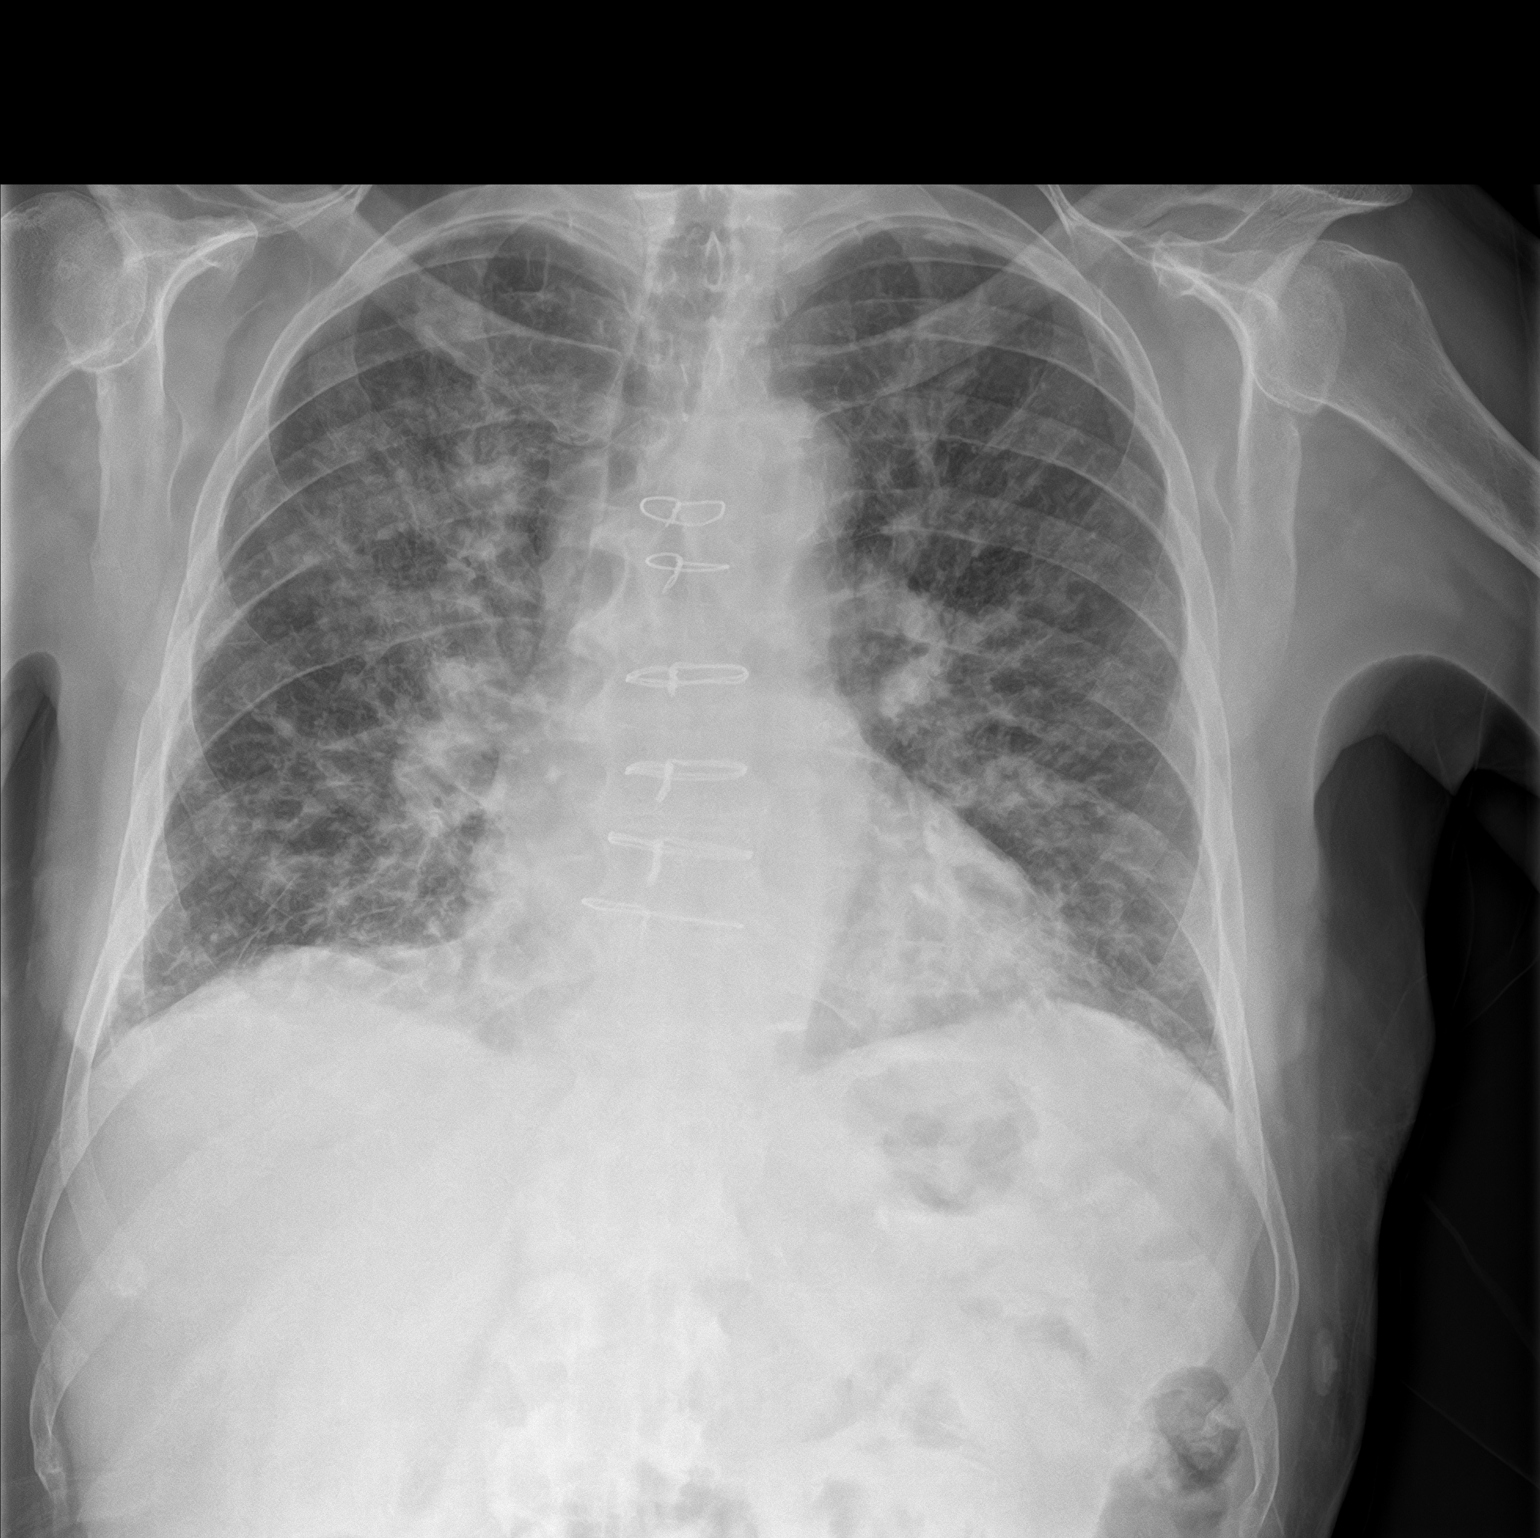
[im 2/2]
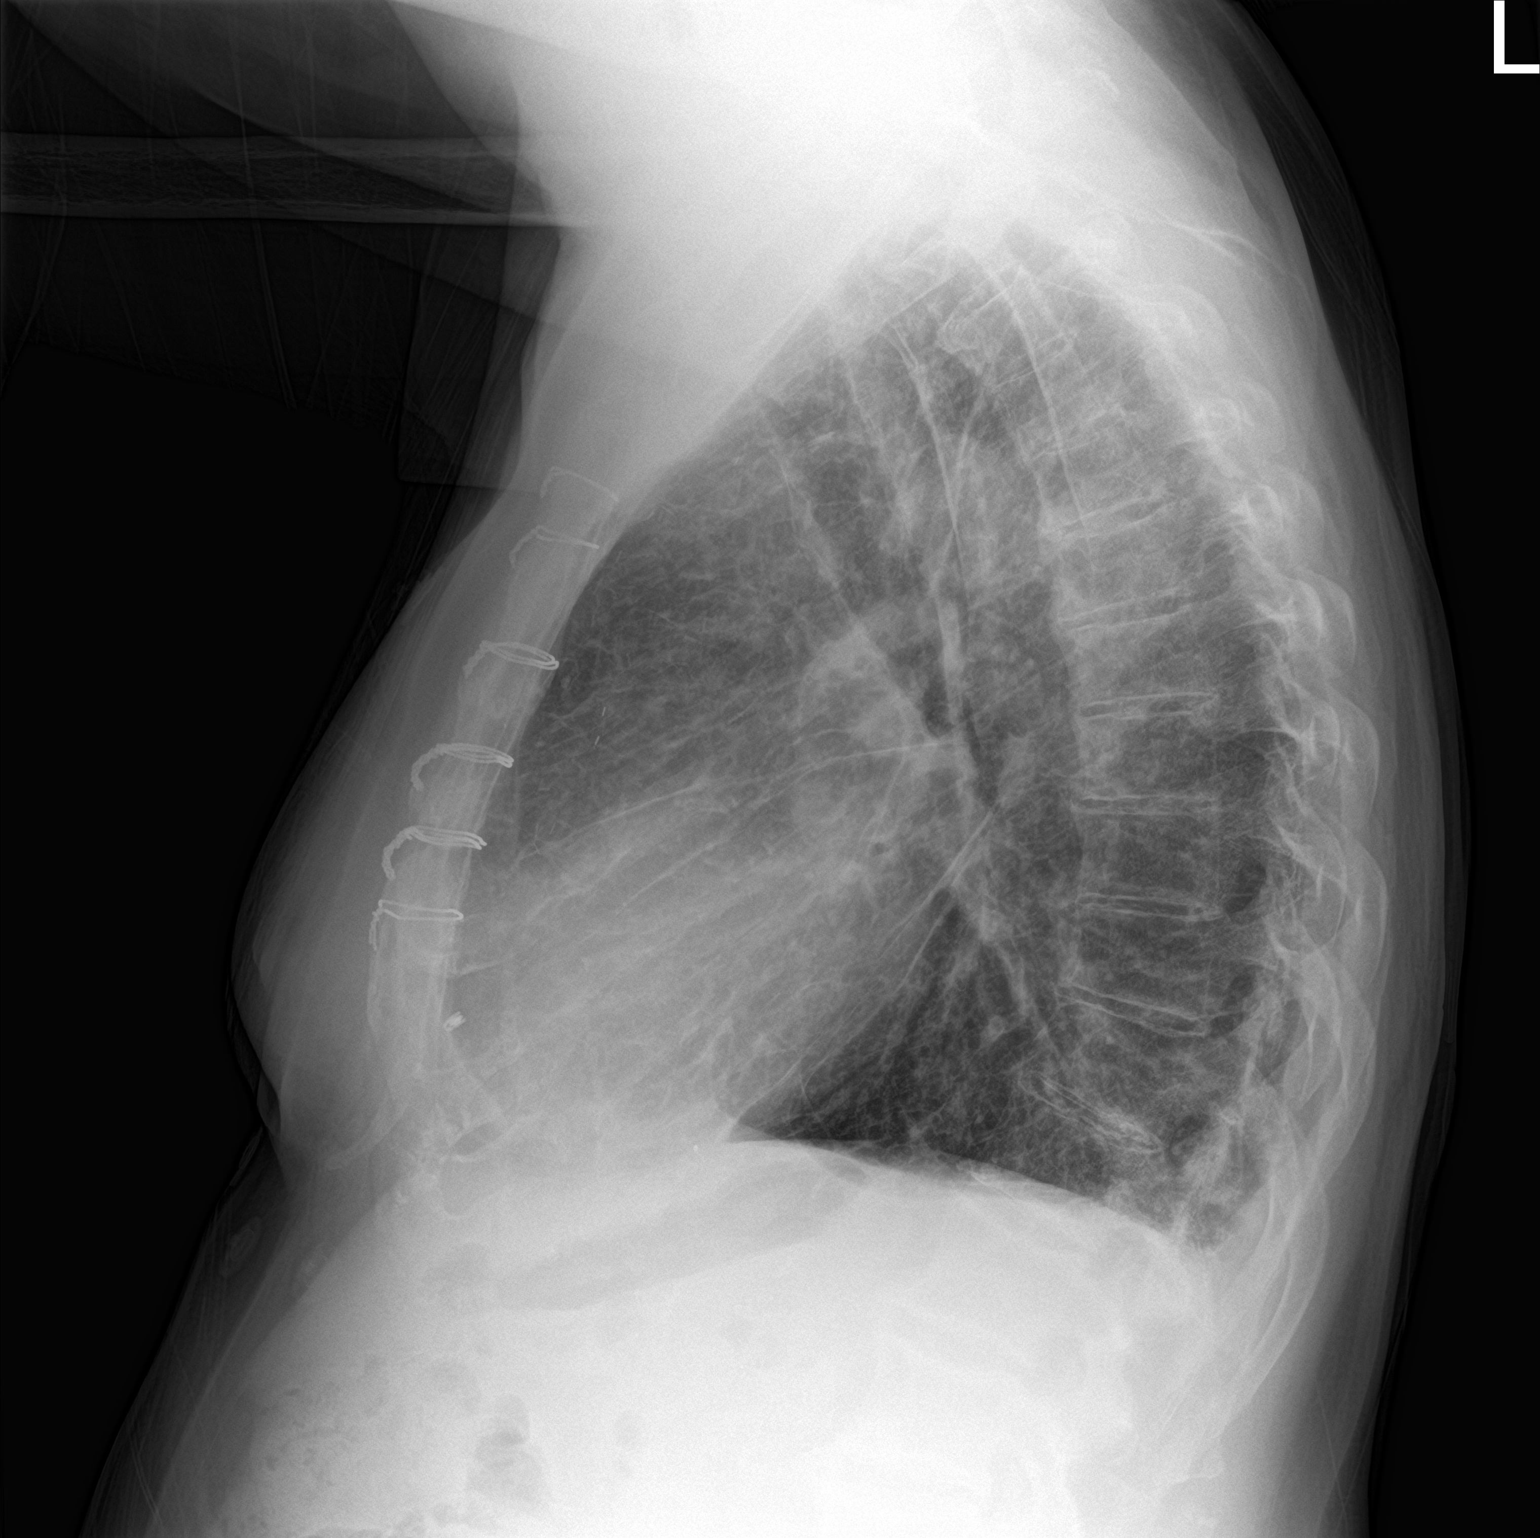

[2 of 2 positions shown; findings below may reference images not displayed]

FINDINGS: The heart size and mediastinal contours are stable status post
median sternotomy and CABG. Interval development of heterogeneous
airspace opacities throughout both lungs with mild fissural
thickening. No confluent airspace opacity, significant pleural
effusion or pneumothorax identified. The bones appear unchanged.
IMPRESSION: Interval development of heterogeneous opacities throughout both
lungs suspicious for atypical multilobar pneumonia.
